# Patient Record
Sex: Female | Born: 1940
Health system: Southern US, Community
[De-identification: ages and names within clinical notes are randomized; demographics above are authoritative.]

## PROBLEM LIST (undated history)

## (undated) DIAGNOSIS — K219 Gastro-esophageal reflux disease without esophagitis: Secondary | ICD-10-CM

## (undated) DIAGNOSIS — D849 Immunodeficiency, unspecified: Secondary | ICD-10-CM

## (undated) DIAGNOSIS — M5136 Other intervertebral disc degeneration, lumbar region: Secondary | ICD-10-CM

## (undated) DIAGNOSIS — M1990 Unspecified osteoarthritis, unspecified site: Secondary | ICD-10-CM

## (undated) DIAGNOSIS — K754 Autoimmune hepatitis: Secondary | ICD-10-CM

## (undated) DIAGNOSIS — M109 Gout, unspecified: Secondary | ICD-10-CM

## (undated) DIAGNOSIS — K648 Other hemorrhoids: Secondary | ICD-10-CM

## (undated) DIAGNOSIS — D649 Anemia, unspecified: Secondary | ICD-10-CM

## (undated) DIAGNOSIS — I878 Other specified disorders of veins: Secondary | ICD-10-CM

## (undated) DIAGNOSIS — I10 Essential (primary) hypertension: Secondary | ICD-10-CM

## (undated) DIAGNOSIS — M51369 Other intervertebral disc degeneration, lumbar region without mention of lumbar back pain or lower extremity pain: Secondary | ICD-10-CM

## (undated) DIAGNOSIS — D72819 Decreased white blood cell count, unspecified: Secondary | ICD-10-CM

## (undated) HISTORY — DX: Other intervertebral disc degeneration, lumbar region without mention of lumbar back pain or lower extremity pain: M51.369

## (undated) HISTORY — PX: APPENDECTOMY: SHX54

## (undated) HISTORY — DX: Other intervertebral disc degeneration, lumbar region: M51.36

## (undated) HISTORY — PX: NECK SURGERY: SHX720

## (undated) HISTORY — DX: Other hemorrhoids: K64.8

## (undated) HISTORY — PX: ABDOMINAL HYSTERECTOMY: SHX81

## (undated) HISTORY — PX: LEFT OOPHORECTOMY: SHX1961

## (undated) HISTORY — DX: Gout, unspecified: M10.9

## (undated) HISTORY — PX: ROTATOR CUFF REPAIR: SHX139

## (undated) HISTORY — PX: BREAST SURGERY: SHX581

## (undated) HISTORY — DX: Autoimmune hepatitis: K75.4

---

## 1978-12-09 HISTORY — PX: BREAST BIOPSY: SHX20

## 1999-12-10 HISTORY — PX: NECK SURGERY: SHX720

## 2000-07-18 ENCOUNTER — Encounter: Payer: Self-pay | Admitting: Neurosurgery

## 2000-07-18 ENCOUNTER — Ambulatory Visit (HOSPITAL_COMMUNITY): Admission: RE | Admit: 2000-07-18 | Discharge: 2000-07-18 | Payer: Self-pay | Admitting: Neurosurgery

## 2000-07-22 ENCOUNTER — Encounter: Payer: Self-pay | Admitting: Neurosurgery

## 2000-07-24 ENCOUNTER — Encounter: Payer: Self-pay | Admitting: Neurosurgery

## 2000-07-24 ENCOUNTER — Inpatient Hospital Stay (HOSPITAL_COMMUNITY): Admission: RE | Admit: 2000-07-24 | Discharge: 2000-07-25 | Payer: Self-pay | Admitting: Neurosurgery

## 2000-08-07 ENCOUNTER — Encounter: Admission: RE | Admit: 2000-08-07 | Discharge: 2000-08-07 | Payer: Self-pay | Admitting: Neurosurgery

## 2000-08-07 ENCOUNTER — Encounter: Payer: Self-pay | Admitting: Neurosurgery

## 2000-11-05 ENCOUNTER — Encounter: Payer: Self-pay | Admitting: Neurosurgery

## 2000-11-05 ENCOUNTER — Encounter: Admission: RE | Admit: 2000-11-05 | Discharge: 2000-11-05 | Payer: Self-pay | Admitting: Neurosurgery

## 2004-11-12 ENCOUNTER — Ambulatory Visit: Payer: Self-pay | Admitting: Internal Medicine

## 2005-06-02 ENCOUNTER — Inpatient Hospital Stay: Payer: Self-pay | Admitting: Internal Medicine

## 2005-06-02 ENCOUNTER — Other Ambulatory Visit: Payer: Self-pay

## 2005-11-21 ENCOUNTER — Ambulatory Visit: Payer: Self-pay | Admitting: Internal Medicine

## 2006-12-04 ENCOUNTER — Ambulatory Visit: Payer: Self-pay | Admitting: Internal Medicine

## 2007-02-20 ENCOUNTER — Encounter: Payer: Self-pay | Admitting: Unknown Physician Specialty

## 2007-03-10 ENCOUNTER — Encounter: Payer: Self-pay | Admitting: Unknown Physician Specialty

## 2007-04-09 ENCOUNTER — Encounter: Payer: Self-pay | Admitting: Unknown Physician Specialty

## 2007-05-10 ENCOUNTER — Encounter: Payer: Self-pay | Admitting: Unknown Physician Specialty

## 2007-05-29 ENCOUNTER — Ambulatory Visit: Payer: Self-pay | Admitting: Cardiology

## 2007-12-17 ENCOUNTER — Ambulatory Visit: Payer: Self-pay | Admitting: Internal Medicine

## 2008-04-05 ENCOUNTER — Ambulatory Visit: Payer: Self-pay | Admitting: Unknown Physician Specialty

## 2008-05-10 ENCOUNTER — Ambulatory Visit: Payer: Self-pay | Admitting: Unknown Physician Specialty

## 2008-09-16 ENCOUNTER — Ambulatory Visit: Payer: Self-pay | Admitting: Internal Medicine

## 2008-12-22 ENCOUNTER — Ambulatory Visit: Payer: Self-pay | Admitting: Internal Medicine

## 2009-11-06 ENCOUNTER — Ambulatory Visit: Payer: Self-pay | Admitting: Gastroenterology

## 2009-12-25 ENCOUNTER — Ambulatory Visit: Payer: Self-pay | Admitting: Internal Medicine

## 2010-10-20 ENCOUNTER — Emergency Department: Payer: Self-pay | Admitting: Unknown Physician Specialty

## 2010-12-27 ENCOUNTER — Ambulatory Visit: Payer: Self-pay | Admitting: Internal Medicine

## 2012-01-07 ENCOUNTER — Ambulatory Visit: Payer: Self-pay | Admitting: Internal Medicine

## 2012-02-03 ENCOUNTER — Ambulatory Visit: Payer: Self-pay | Admitting: Gastroenterology

## 2012-06-19 ENCOUNTER — Emergency Department: Payer: Self-pay | Admitting: Emergency Medicine

## 2012-06-19 LAB — COMPREHENSIVE METABOLIC PANEL
Albumin: 3.4 g/dL (ref 3.4–5.0)
Alkaline Phosphatase: 58 U/L (ref 50–136)
Anion Gap: 8 (ref 7–16)
BUN: 10 mg/dL (ref 7–18)
Bilirubin,Total: 0.7 mg/dL (ref 0.2–1.0)
Calcium, Total: 9.2 mg/dL (ref 8.5–10.1)
Chloride: 102 mmol/L (ref 98–107)
Co2: 30 mmol/L (ref 21–32)
Creatinine: 0.99 mg/dL (ref 0.60–1.30)
EGFR (African American): >60
EGFR (Non-African Amer.): 58 — ABNORMAL LOW
Glucose: 106 mg/dL — ABNORMAL HIGH (ref 65–99)
Osmolality: 279 (ref 275–301)
Potassium: 3.1 mmol/L — ABNORMAL LOW (ref 3.5–5.1)
SGOT(AST): 22 U/L (ref 15–37)
SGPT (ALT): 20 U/L
Sodium: 140 mmol/L (ref 136–145)
Total Protein: 7.7 g/dL (ref 6.4–8.2)

## 2012-06-19 LAB — CBC
HCT: 37.1 % (ref 35.0–47.0)
HGB: 12.4 g/dL (ref 12.0–16.0)
MCH: 30.8 pg (ref 26.0–34.0)
MCHC: 33.5 g/dL (ref 32.0–36.0)
MCV: 92 fL (ref 80–100)
Platelet: 194 10*3/uL (ref 150–440)
RBC: 4.03 10*6/uL (ref 3.80–5.20)
RDW: 14.2 % (ref 11.5–14.5)
WBC: 5.3 10*3/uL (ref 3.6–11.0)

## 2012-06-19 LAB — TROPONIN I: Troponin-I: 0.03 ng/mL

## 2012-06-19 LAB — CK TOTAL AND CKMB (NOT AT ARMC)
CK, Total: 64 U/L (ref 21–215)
CK-MB: <0.5 ng/mL — ABNORMAL LOW (ref 0.5–3.6)

## 2012-12-09 HISTORY — PX: ROTATOR CUFF REPAIR: SHX139

## 2013-01-07 ENCOUNTER — Ambulatory Visit: Payer: Self-pay | Admitting: Internal Medicine

## 2013-01-11 ENCOUNTER — Ambulatory Visit: Payer: Self-pay | Admitting: Gastroenterology

## 2013-04-22 ENCOUNTER — Ambulatory Visit: Payer: Self-pay | Admitting: Unknown Physician Specialty

## 2013-05-13 ENCOUNTER — Ambulatory Visit: Payer: Self-pay | Admitting: Anesthesiology

## 2013-05-13 LAB — ELECTROLYTE PANEL
Anion Gap: 9 (ref 7–16)
Chloride: 101 mmol/L (ref 98–107)
Potassium: 3.6 mmol/L (ref 3.5–5.1)
Sodium: 139 mmol/L (ref 136–145)

## 2013-05-21 ENCOUNTER — Ambulatory Visit: Payer: Self-pay | Admitting: Unknown Physician Specialty

## 2013-08-30 ENCOUNTER — Ambulatory Visit: Payer: Self-pay | Admitting: Gastroenterology

## 2013-09-20 DIAGNOSIS — R3129 Other microscopic hematuria: Secondary | ICD-10-CM | POA: Insufficient documentation

## 2013-09-20 DIAGNOSIS — N281 Cyst of kidney, acquired: Secondary | ICD-10-CM | POA: Insufficient documentation

## 2013-12-11 ENCOUNTER — Emergency Department: Payer: Self-pay | Admitting: Emergency Medicine

## 2013-12-11 LAB — URINALYSIS, COMPLETE
BILIRUBIN, UR: NEGATIVE
Bacteria: NONE SEEN
GLUCOSE, UR: NEGATIVE mg/dL (ref 0–75)
Ketone: NEGATIVE
Leukocyte Esterase: NEGATIVE
Nitrite: NEGATIVE
PH: 7 (ref 4.5–8.0)
Protein: NEGATIVE
RBC,UR: 2 /HPF (ref 0–5)
SPECIFIC GRAVITY: 1.004 (ref 1.003–1.030)
WBC UR: <1 /HPF (ref 0–5)

## 2013-12-11 LAB — CBC WITH DIFFERENTIAL/PLATELET
BASOS ABS: 0 10*3/uL (ref 0.0–0.1)
Basophil %: 0.6 %
EOS ABS: 0 10*3/uL (ref 0.0–0.7)
Eosinophil %: 0.1 %
HCT: 35.7 % (ref 35.0–47.0)
HGB: 12.2 g/dL (ref 12.0–16.0)
LYMPHS PCT: 12.4 %
Lymphocyte #: 0.7 10*3/uL — ABNORMAL LOW (ref 1.0–3.6)
MCH: 30.6 pg (ref 26.0–34.0)
MCHC: 34.3 g/dL (ref 32.0–36.0)
MCV: 89 fL (ref 80–100)
Monocyte #: 0.4 x10 3/mm (ref 0.2–0.9)
Monocyte %: 6.3 %
NEUTROS ABS: 4.6 10*3/uL (ref 1.4–6.5)
NEUTROS PCT: 80.6 %
PLATELETS: 250 10*3/uL (ref 150–440)
RBC: 4 10*6/uL (ref 3.80–5.20)
RDW: 13.8 % (ref 11.5–14.5)
WBC: 5.8 10*3/uL (ref 3.6–11.0)

## 2013-12-11 LAB — COMPREHENSIVE METABOLIC PANEL
ALK PHOS: 57 U/L
Albumin: 3.5 g/dL (ref 3.4–5.0)
Anion Gap: 6 — ABNORMAL LOW (ref 7–16)
BUN: 10 mg/dL (ref 7–18)
Bilirubin,Total: 0.6 mg/dL (ref 0.2–1.0)
Calcium, Total: 9.9 mg/dL (ref 8.5–10.1)
Chloride: 104 mmol/L (ref 98–107)
Co2: 28 mmol/L (ref 21–32)
Creatinine: 0.91 mg/dL (ref 0.60–1.30)
EGFR (African American): >60
GLUCOSE: 164 mg/dL — AB (ref 65–99)
Osmolality: 278 (ref 275–301)
POTASSIUM: 3.7 mmol/L (ref 3.5–5.1)
SGOT(AST): 16 U/L (ref 15–37)
SGPT (ALT): 20 U/L (ref 12–78)
SODIUM: 138 mmol/L (ref 136–145)
Total Protein: 7.7 g/dL (ref 6.4–8.2)

## 2013-12-11 LAB — PROTIME-INR
INR: 1
Prothrombin Time: 13.4 secs (ref 11.5–14.7)

## 2013-12-11 LAB — HEMATOCRIT: HCT: 35.4 % (ref 35.0–47.0)

## 2013-12-11 LAB — TROPONIN I: Troponin-I: <0.02 ng/mL

## 2013-12-11 LAB — LIPASE, BLOOD: LIPASE: 80 U/L (ref 73–393)

## 2013-12-11 LAB — HEMOGLOBIN: HGB: 12.2 g/dL (ref 12.0–16.0)

## 2014-01-21 ENCOUNTER — Ambulatory Visit: Payer: Self-pay | Admitting: Internal Medicine

## 2014-10-21 DIAGNOSIS — M542 Cervicalgia: Secondary | ICD-10-CM | POA: Insufficient documentation

## 2015-01-30 DIAGNOSIS — E538 Deficiency of other specified B group vitamins: Secondary | ICD-10-CM | POA: Insufficient documentation

## 2015-02-09 ENCOUNTER — Ambulatory Visit: Payer: Self-pay | Admitting: Internal Medicine

## 2015-04-28 ENCOUNTER — Inpatient Hospital Stay
Admission: EM | Admit: 2015-04-28 | Discharge: 2015-05-01 | DRG: 640 | Disposition: A | Discharge status: Home / Self Care | Payer: Medicare Other | Attending: Internal Medicine | Admitting: Internal Medicine

## 2015-04-28 ENCOUNTER — Emergency Department: Payer: Medicare Other

## 2015-04-28 ENCOUNTER — Encounter: Payer: Self-pay | Admitting: Medical Oncology

## 2015-04-28 DIAGNOSIS — Z90710 Acquired absence of both cervix and uterus: Secondary | ICD-10-CM

## 2015-04-28 DIAGNOSIS — I10 Essential (primary) hypertension: Secondary | ICD-10-CM | POA: Diagnosis present

## 2015-04-28 DIAGNOSIS — K754 Autoimmune hepatitis: Secondary | ICD-10-CM | POA: Diagnosis present

## 2015-04-28 DIAGNOSIS — R531 Weakness: Secondary | ICD-10-CM

## 2015-04-28 DIAGNOSIS — R05 Cough: Secondary | ICD-10-CM

## 2015-04-28 DIAGNOSIS — Z880 Allergy status to penicillin: Secondary | ICD-10-CM

## 2015-04-28 DIAGNOSIS — Z833 Family history of diabetes mellitus: Secondary | ICD-10-CM | POA: Diagnosis not present

## 2015-04-28 DIAGNOSIS — R042 Hemoptysis: Secondary | ICD-10-CM | POA: Diagnosis present

## 2015-04-28 DIAGNOSIS — R509 Fever, unspecified: Secondary | ICD-10-CM

## 2015-04-28 DIAGNOSIS — J189 Pneumonia, unspecified organism: Secondary | ICD-10-CM | POA: Diagnosis present

## 2015-04-28 DIAGNOSIS — E871 Hypo-osmolality and hyponatremia: Principal | ICD-10-CM | POA: Diagnosis present

## 2015-04-28 DIAGNOSIS — Z9049 Acquired absence of other specified parts of digestive tract: Secondary | ICD-10-CM | POA: Diagnosis present

## 2015-04-28 DIAGNOSIS — R059 Cough, unspecified: Secondary | ICD-10-CM

## 2015-04-28 HISTORY — DX: Immunodeficiency, unspecified: D84.9

## 2015-04-28 HISTORY — DX: Essential (primary) hypertension: I10

## 2015-04-28 HISTORY — DX: Decreased white blood cell count, unspecified: D72.819

## 2015-04-28 HISTORY — DX: Other specified disorders of veins: I87.8

## 2015-04-28 HISTORY — DX: Anemia, unspecified: D64.9

## 2015-04-28 LAB — URINALYSIS COMPLETE WITH MICROSCOPIC (ARMC ONLY)
Bacteria, UA: NONE SEEN
Bilirubin Urine: NEGATIVE
Glucose, UA: NEGATIVE mg/dL
KETONES UR: NEGATIVE mg/dL
Leukocytes, UA: NEGATIVE
Nitrite: NEGATIVE
PH: 6 (ref 5.0–8.0)
Protein, ur: NEGATIVE mg/dL
Specific Gravity, Urine: 1.008 (ref 1.005–1.030)

## 2015-04-28 LAB — BASIC METABOLIC PANEL
Anion gap: 8 (ref 5–15)
BUN: 8 mg/dL (ref 6–20)
CALCIUM: 8.6 mg/dL — AB (ref 8.9–10.3)
CHLORIDE: 92 mmol/L — AB (ref 101–111)
CO2: 23 mmol/L (ref 22–32)
CREATININE: 1.04 mg/dL — AB (ref 0.44–1.00)
GFR calc Af Amer: >60 mL/min (ref >60)
GFR calc non Af Amer: 53 mL/min — ABNORMAL LOW (ref >60)
GLUCOSE: 122 mg/dL — AB (ref 65–99)
Potassium: 4.1 mmol/L (ref 3.5–5.1)
Sodium: 123 mmol/L — ABNORMAL LOW (ref 135–145)

## 2015-04-28 LAB — TSH: TSH: 3.467 u[IU]/mL (ref 0.350–4.500)

## 2015-04-28 LAB — CBC
HEMATOCRIT: 30.5 % — AB (ref 35.0–47.0)
Hemoglobin: 10.5 g/dL — ABNORMAL LOW (ref 12.0–16.0)
MCH: 29.7 pg (ref 26.0–34.0)
MCHC: 34.3 g/dL (ref 32.0–36.0)
MCV: 86.7 fL (ref 80.0–100.0)
Platelets: 247 10*3/uL (ref 150–440)
RBC: 3.52 MIL/uL — ABNORMAL LOW (ref 3.80–5.20)
RDW: 14.7 % — ABNORMAL HIGH (ref 11.5–14.5)
WBC: 3.6 10*3/uL (ref 3.6–11.0)

## 2015-04-28 LAB — HEPATIC FUNCTION PANEL
ALT: 15 U/L (ref 14–54)
AST: 31 U/L (ref 15–41)
Albumin: 3.2 g/dL — ABNORMAL LOW (ref 3.5–5.0)
Alkaline Phosphatase: 56 U/L (ref 38–126)
Bilirubin, Direct: 0.1 mg/dL (ref 0.1–0.5)
Indirect Bilirubin: 0.3 mg/dL (ref 0.3–0.9)
TOTAL PROTEIN: 7.4 g/dL (ref 6.5–8.1)
Total Bilirubin: 0.4 mg/dL (ref 0.3–1.2)

## 2015-04-28 MED ORDER — ISOSORBIDE MONONITRATE ER 60 MG PO TB24
120.0000 mg | ORAL_TABLET | Freq: Every day | ORAL | Status: DC
  Administered 2015-04-29 – 2015-05-01 (×3): 120 mg via ORAL
  Filled 2015-04-28 (×3): qty 2

## 2015-04-28 MED ORDER — DILTIAZEM HCL ER 240 MG PO CP24
240.0000 mg | ORAL_CAPSULE | Freq: Every day | ORAL | Status: DC
  Administered 2015-04-29 – 2015-05-01 (×3): 240 mg via ORAL
  Filled 2015-04-28 (×5): qty 1

## 2015-04-28 MED ORDER — ACETAMINOPHEN 325 MG PO TABS
650.0000 mg | ORAL_TABLET | Freq: Four times a day (QID) | ORAL | Status: DC | PRN
  Administered 2015-04-29 – 2015-04-30 (×4): 650 mg via ORAL
  Filled 2015-04-28 (×4): qty 2

## 2015-04-28 MED ORDER — LORATADINE 10 MG PO TABS: 10.0000 mg | ORAL_TABLET | Freq: Every day | ORAL | Status: DC

## 2015-04-28 MED ORDER — CARVEDILOL 25 MG PO TABS
25.0000 mg | ORAL_TABLET | Freq: Two times a day (BID) | ORAL | Status: DC
  Administered 2015-04-29 – 2015-05-01 (×5): 25 mg via ORAL
  Filled 2015-04-28 (×5): qty 1

## 2015-04-28 MED ORDER — DOCUSATE SODIUM 100 MG PO CAPS
100.0000 mg | ORAL_CAPSULE | Freq: Two times a day (BID) | ORAL | Status: DC
  Administered 2015-04-29 – 2015-05-01 (×6): 100 mg via ORAL
  Filled 2015-04-28 (×6): qty 1

## 2015-04-28 MED ORDER — SODIUM CHLORIDE 0.9 % IV SOLN
INTRAVENOUS | Status: DC
  Administered 2015-04-29 – 2015-04-30 (×3): via INTRAVENOUS

## 2015-04-28 MED ORDER — LEVOFLOXACIN 750 MG PO TABS
ORAL_TABLET | ORAL | Status: AC
  Administered 2015-04-28: 750 mg via ORAL
  Filled 2015-04-28: qty 1

## 2015-04-28 MED ORDER — MORPHINE SULFATE 2 MG/ML IJ SOLN: 2.0000 mg | INTRAMUSCULAR | Status: DC | PRN

## 2015-04-28 MED ORDER — HEPARIN SODIUM (PORCINE) 5000 UNIT/ML IJ SOLN
5000.0000 [IU] | Freq: Three times a day (TID) | INTRAMUSCULAR | Status: DC
  Administered 2015-04-29 – 2015-05-01 (×9): 5000 [IU] via SUBCUTANEOUS
  Filled 2015-04-28 (×10): qty 1

## 2015-04-28 MED ORDER — CALCIUM CARBONATE-VITAMIN D 500-200 MG-UNIT PO TABS
1.0000 | ORAL_TABLET | Freq: Two times a day (BID) | ORAL | Status: DC
  Administered 2015-04-29 – 2015-05-01 (×6): 1 via ORAL
  Filled 2015-04-28 (×6): qty 1

## 2015-04-28 MED ORDER — ONDANSETRON HCL 4 MG/2ML IJ SOLN
4.0000 mg | Freq: Four times a day (QID) | INTRAMUSCULAR | Status: DC | PRN
  Administered 2015-04-29 (×2): 4 mg via INTRAVENOUS
  Filled 2015-04-28 (×2): qty 2

## 2015-04-28 MED ORDER — ONDANSETRON HCL 4 MG PO TABS
4.0000 mg | ORAL_TABLET | Freq: Four times a day (QID) | ORAL | Status: DC | PRN
  Administered 2015-04-30: 4 mg via ORAL
  Filled 2015-04-28: qty 1

## 2015-04-28 MED ORDER — ACETAMINOPHEN 650 MG RE SUPP: 650.0000 mg | Freq: Four times a day (QID) | RECTAL | Status: DC | PRN

## 2015-04-28 MED ORDER — MINOXIDIL 2.5 MG PO TABS
2.5000 mg | ORAL_TABLET | Freq: Two times a day (BID) | ORAL | Status: DC
  Administered 2015-04-29 – 2015-05-01 (×6): 2.5 mg via ORAL
  Filled 2015-04-28 (×7): qty 1

## 2015-04-28 MED ORDER — ASPIRIN EC 81 MG PO TBEC
81.0000 mg | DELAYED_RELEASE_TABLET | Freq: Every day | ORAL | Status: DC
  Administered 2015-04-29 – 2015-05-01 (×3): 81 mg via ORAL
  Filled 2015-04-28 (×3): qty 1

## 2015-04-28 MED ORDER — AZATHIOPRINE 50 MG PO TABS
50.0000 mg | ORAL_TABLET | Freq: Every day | ORAL | Status: DC
  Administered 2015-04-29 – 2015-04-30 (×2): 50 mg via ORAL
  Filled 2015-04-28 (×3): qty 1

## 2015-04-28 MED ORDER — LEVOFLOXACIN 750 MG PO TABS
750.0000 mg | ORAL_TABLET | Freq: Once | ORAL | Status: AC
  Administered 2015-04-28: 750 mg via ORAL

## 2015-04-28 MED ORDER — SODIUM CHLORIDE 0.9 % IV BOLUS (SEPSIS)
1000.0000 mL | Freq: Once | INTRAVENOUS | Status: AC
  Administered 2015-04-28: 1000 mL via INTRAVENOUS

## 2015-04-28 MED ORDER — PANTOPRAZOLE SODIUM 40 MG PO TBEC
40.0000 mg | DELAYED_RELEASE_TABLET | Freq: Every day | ORAL | Status: DC
Start: 2015-04-29 — End: 2015-05-01
  Administered 2015-04-29 – 2015-05-01 (×3): 40 mg via ORAL
  Filled 2015-04-28 (×3): qty 1

## 2015-04-28 NOTE — ED Provider Notes (Signed)
[facility] Emergency Department Provider Note  Time seen: 9:08 PM  I have reviewed the triage vital signs and the nursing notes.   HISTORY  Chief Complaint Fatigue and Weakness    HPI Brittney Clark is a 74 y.o. female with a past medical history of hypertension, anemia presents the emergency department with 5 days of weakness/fatigue symptoms. Patient was seen by her primary care doctor yesterday who ordered blood work and placed the patient on doxycycline for possible tick bite exposure although patient does not recall actually being bit. Patient states for the last 3 days she has been having fevers to 102 at home, and generalized weakness symptoms. Denies headache, confusion, abdominal pain, chest pain, cough/congestion, or sore throat. Patient does state some nausea with one episode of vomiting yesterday. 1-2 episodes of diarrhea over the last few days but not constant. Patient states her primary care doctor did blood work yesterday and called her today due to a low sodium told her to go to the urgent care to have her blood work redrawn which she did and they sent her to the ER.    Past Medical History  Diagnosis Date  . Hypertension   . Immune deficiency disorder   . Leukopenia   . Anemia   . Venous stasis     There are no active problems to display for this patient.   Past Surgical History  Procedure Laterality Date  . Neck surgery    . Abdominal hysterectomy    . Breast surgery    . Appendectomy    . Rotator cuff repair      No current outpatient prescriptions on file.  Allergies Penicillins  No family history on file.  Social History History  Substance Use Topics  . Smoking status: Never Smoker   . Smokeless tobacco: Not on file  . Alcohol Use: No    Review of Systems Constitutional: Positive for fever to 102. ENT: Negative for congestion Cardiovascular: Negative for chest pain. Respiratory: Negative for shortness of  breath. Gastrointestinal: Negative for abdominal pain. Positive for one episode of vomiting, one to 2 episodes of diarrhea. Genitourinary: Negative for dysuria. Musculoskeletal: Negative for back pain. Skin: Negative for rash. Neurological: Negative for headaches, focal weakness or numbness, but positive for generalized weakness.  10-point ROS otherwise negative.  ____________________________________________   PHYSICAL EXAM:  VITAL SIGNS: ED Triage Vitals  Enc Vitals Group     BP 04/28/15 1836 122/63 mmHg     Pulse Rate 04/28/15 1836 78     Resp 04/28/15 1836 18     Temp 04/28/15 1836 98.1 F (36.7 C)     Temp Source 04/28/15 1836 Oral     SpO2 04/28/15 1836 96 %     Weight 04/28/15 1836 161 lb (73.029 kg)     Height 04/28/15 1836 5\' 5"  (1.651 m)     Head Cir --      Peak Flow --      Pain Score 04/28/15 1837 0     Pain Loc --      Pain Edu? --      Excl. in [place]? --     Constitutional: Alert and oriented. Well appearing and in no distress. Eyes: Normal exam ENT   Head: Normocephalic and atraumatic.   Nose: No congestion/rhinnorhea.   Mouth/Throat: Mucous membranes are moist. Cardiovascular: Normal rate, regular rhythm. No murmurs, rubs, or gallops. Respiratory: Normal respiratory effort without tachypnea nor retractions. Breath sounds are clear and equal  bilaterally. No wheezes/rales/rhonchi. Gastrointestinal: Soft and nontender. No distention.   Musculoskeletal: Nontender with normal range of motion in all extremities. Neurologic:  Normal speech and language. No gross focal neurologic deficits are appreciated. Speech is normal. Skin:  Skin is warm, dry and intact.  Psychiatric: Mood and affect are normal. Speech and behavior are normal. Patient exhibits appropriate insight and judgment.  ____________________________________________    EKG  EKG reviewed and interpreted by myself shows normal sinus rhythm at 74 bpm, narrow QRS, normal axis, normal  intervals. Nonspecific ST changes.  ____________________________________________    RADIOLOGY  Chest x-ray shows right basilar opacity which may reflect atelectasis versus pneumonia.  ____________________________________________   INITIAL IMPRESSION / ASSESSMENT AND PLAN / ED COURSE  Pertinent labs & imaging results that were available during my care of the patient were reviewed by me and considered in my medical decision making (see chart for details).  74 year old female with 5 days of generalized weakness, fever, and low sodium level. Patient's sodium today is 123, labs otherwise are largely within normal limits. Overall patient appears very well, with minimal complaints. Patient is currently taking doxycycline prescribed by her primary care doctor for possible tick exposure. I have added on several labs including TSH, liver function testing, and a chest x-ray to further evaluate. I placed the patient on normal saline IV hydration. Patient will require admission to the hospital for further workup/evaluation.  Mild airspace opacity on chest x-ray we'll cover with antibiotics and discussed this with the hospitalist for further workup and possible CT if indicated.  ____________________________________________   FINAL CLINICAL IMPRESSION(S) / ED DIAGNOSES  Hyponatremia Fever Generalized weakness Pneumonia   Harvest Dark, MD 04/28/15 2131

## 2015-04-28 NOTE — H&P (Signed)
[place] [facility] [place] NAME: Brittney Clark    MR#:  [id number]  DATE OF BIRTH:  03-16-1941   DATE OF ADMISSION:  04/28/2015  PRIMARY CARE PHYSICIAN: Tama High III, MD   REQUESTING/REFERRING PHYSICIAN: Paduchowski  CHIEF COMPLAINT:   Chief Complaint  Patient presents with  . Fatigue  . Weakness    HISTORY OF PRESENT ILLNESS:  Brittney Clark  is a 74 y.o. female with a known history of essential hypertension presenting with abnormal labs. She describes having weakness and generalized fatigue for approximately 3 days with associated nausea, intermittent fever, cough-nonproductive. Saw her PCP started on doxycycline and had routine blood work obtained. She was noted to be hyponatremic with sodium levels of 121 thus advised to present to Hospital further workup and evaluation.  PAST MEDICAL HISTORY:   Past Medical History  Diagnosis Date  . Hypertension   . Immune deficiency disorder   . Leukopenia   . Anemia   . Venous stasis     PAST SURGICAL HISTORY:   Past Surgical History  Procedure Laterality Date  . Neck surgery    . Abdominal hysterectomy    . Breast surgery    . Appendectomy    . Rotator cuff repair      SOCIAL HISTORY:   History  Substance Use Topics  . Smoking status: Never Smoker   . Smokeless tobacco: Not on file  . Alcohol Use: No    FAMILY HISTORY:   Family History  Problem Relation Age of Onset  . Diabetes Neg Hx     DRUG ALLERGIES:   Allergies  Allergen Reactions  . Penicillins Rash    REVIEW OF SYSTEMS:  REVIEW OF SYSTEMS:  CONSTITUTIONAL: Denies fevers, chills,positive for fatigue, weakness.  EYES: Denies blurred vision, double vision, or eye pain.  EARS, NOSE, THROAT: Denies tinnitus, ear pain, hearing loss.  RESPIRATORY: positive for cough, deniesshortness of breath, wheezing  CARDIOVASCULAR: Denies chest pain, palpitations, edema.  GASTROINTESTINAL: positivenausea,  deniesvomiting, diarrhea, abdominal pain.  GENITOURINARY: Denies dysuria, hematuria.  ENDOCRINE: Denies nocturia or thyroid problems. HEMATOLOGIC AND LYMPHATIC: Denies easy bruising or bleeding.  SKIN: Denies rash or lesions.  MUSCULOSKELETAL: Denies pain in neck, back, shoulder, knees, hips, or further arthritic symptoms.  NEUROLOGIC: Denies paralysis, paresthesias.  PSYCHIATRIC: Denies anxiety or depressive symptoms. Otherwise full review of systems performed by me is negative.   MEDICATIONS AT HOME:   Prior to Admission medications   Not on File      VITAL SIGNS:  Blood pressure 157/86, pulse 87, temperature 98.1 F (36.7 C), temperature source Oral, resp. rate 18, height 5\' 5"  (1.651 m), weight 161 lb (73.029 kg), SpO2 97 %.  PHYSICAL EXAMINATION:  VITAL SIGNS: Filed Vitals:   04/28/15 2040  BP: 157/86  Pulse: 87  Temp:   Resp: 29   GENERAL:73 y.o.female currently in no acute distress.  HEAD: Normocephalic, atraumatic.  EYES: Pupils equal, round, reactive to light. Extraocular muscles intact. No scleral icterus.  MOUTH: Moist mucosal membrane. Dentition intact. No abscess noted.  EAR, NOSE, THROAT: Clear without exudates. No external lesions.  NECK: Supple. No thyromegaly. No nodules. No JVD.  PULMONARY: Clear to ascultation, without wheeze rails or rhonci. No use of accessory muscles, Good respiratory effort. good air entry bilaterally CHEST: Nontender to palpation.  CARDIOVASCULAR: S1 and S2. Regular rate and rhythm. No murmurs, rubs, or gallops. No edema. Pedal pulses 2+ bilaterally.  GASTROINTESTINAL: Soft, nontender, nondistended. No masses.  Positive bowel sounds. No hepatosplenomegaly.  MUSCULOSKELETAL: No swelling, clubbing, or edema. Range of motion full in all extremities.  NEUROLOGIC: Cranial nerves II through XII are intact. No gross focal neurological deficits. Sensation intact. Reflexes intact.  SKIN: No ulceration, lesions, rashes, or cyanosis. Skin warm  and dry. Turgor intact.  PSYCHIATRIC: Mood, affect within normal limits. The patient is awake, alert and oriented x 3. Insight, judgment intact.    LABORATORY PANEL:   CBC  Recent Labs Lab 04/28/15 1852  WBC 3.6  HGB 10.5*  HCT 30.5*  PLT 247   ------------------------------------------------------------------------------------------------------------------  Chemistries   Recent Labs Lab 04/28/15 1852  NA 123*  K 4.1  CL 92*  CO2 23  GLUCOSE 122*  BUN 8  CREATININE 1.04*  CALCIUM 8.6*   ------------------------------------------------------------------------------------------------------------------  Cardiac Enzymes No results for input(s): TROPONINI in the last 168 hours. ------------------------------------------------------------------------------------------------------------------  RADIOLOGY:  Dg Chest Portable 1 View  04/28/2015   CLINICAL DATA:  Acute onset of cough.  Weakness.  Initial encounter.  EXAM: PORTABLE CHEST - 1 VIEW  COMPARISON:  Chest radiograph from 06/19/2012  FINDINGS: The lungs are well-aerated. Pulmonary vascularity is at the upper limits of normal. Mild right basilar airspace opacity may reflect atelectasis or possibly mild pneumonia. There is no evidence of pleural effusion or pneumothorax.  The cardiomediastinal silhouette is mildly enlarged. No acute osseous abnormalities are seen. Cervical spinal fusion hardware is noted.  IMPRESSION: Mild cardiomegaly; mild right basilar airspace opacity may reflect atelectasis or possibly mild pneumonia.   Electronically Signed   By: Garald Balding M.D.   On: 04/28/2015 21:26    EKG:   Orders placed or performed during the hospital encounter of 04/28/15  . ED EKG  . ED EKG    IMPRESSION AND PLAN:   74 year old African female history of essential hypertension presenting with generalized weakness and abnormal labs.  1. Hyponatremia: Check urine sodium/osmolality, sodium deficit of 365 mEq she has  received 1 L normal saline in emergency department she will require 57 mL/h for the next 24 hours to correct her sodium to 133. She is presenting hydrochlorothiazide continue to hold this, given findings suggestive of pneumonia also question SIADH possibility  2. Community acquiredpneumonia: Started on Levaquin per ER staff, continue 3.Essential hypertension: Continue to hold diuretics 4.Venous thromboembolism prophylactic: Heparin subcutaneous     All the records are reviewed and case discussed with ED provider. Management plans discussed with the patient, family and they are in agreement.  CODE STATUS: full  TOTAL TIME TAKING CARE OF THIS PATIENT: 35 minutes.    Hower,  Karenann Cai.D on 04/28/2015 at 10:04 PM  Between 7am to 6pm - Pager - [phone]  After 6pm: House Pager: - [phone]  [facility]  Office  [phone]  CC: Primary care physician; Adin Hector, MD

## 2015-04-28 NOTE — ED Notes (Signed)
Pt to triage with reports that pt has been feeling weak and running fever since Tuesday with n/v/d. States that she went to pcp yesterday and was placed on doxycycline. States that she continues to feel weak and tired.

## 2015-04-29 LAB — BASIC METABOLIC PANEL
Anion gap: 7 (ref 5–15)
BUN: 6 mg/dL (ref 6–20)
CALCIUM: 8.5 mg/dL — AB (ref 8.9–10.3)
CHLORIDE: 99 mmol/L — AB (ref 101–111)
CO2: 24 mmol/L (ref 22–32)
Creatinine, Ser: 1.01 mg/dL — ABNORMAL HIGH (ref 0.44–1.00)
GFR calc Af Amer: >60 mL/min (ref >60)
GFR, EST NON AFRICAN AMERICAN: 55 mL/min — AB (ref >60)
GLUCOSE: 100 mg/dL — AB (ref 65–99)
Potassium: 3.8 mmol/L (ref 3.5–5.1)
Sodium: 130 mmol/L — ABNORMAL LOW (ref 135–145)

## 2015-04-29 LAB — CBC
HEMATOCRIT: 31.5 % — AB (ref 35.0–47.0)
HEMOGLOBIN: 10.5 g/dL — AB (ref 12.0–16.0)
MCH: 28.9 pg (ref 26.0–34.0)
MCHC: 33.2 g/dL (ref 32.0–36.0)
MCV: 87 fL (ref 80.0–100.0)
Platelets: 247 10*3/uL (ref 150–440)
RBC: 3.62 MIL/uL — AB (ref 3.80–5.20)
RDW: 15.3 % — ABNORMAL HIGH (ref 11.5–14.5)
WBC: 3.5 10*3/uL — AB (ref 3.6–11.0)

## 2015-04-29 LAB — SODIUM, URINE, RANDOM: SODIUM UR: 108 mmol/L

## 2015-04-29 LAB — OSMOLALITY, URINE: Osmolality, Ur: 427 mOsm/kg (ref 300–900)

## 2015-04-29 MED ORDER — LEVOFLOXACIN 500 MG PO TABS
500.0000 mg | ORAL_TABLET | Freq: Every day | ORAL | Status: DC
  Administered 2015-04-29 – 2015-04-30 (×2): 500 mg via ORAL
  Filled 2015-04-29 (×2): qty 1

## 2015-04-29 NOTE — Progress Notes (Signed)
Pt. Alert and oriented. Vss. No c/o pain. Pills whole with water. Pt. Spiked a temp. During the early morning. Tylenol administered. Up to bathroom with stand-by assist. Rested quietly throughout the night.

## 2015-04-29 NOTE — Progress Notes (Signed)
Pt requested her usual dose of potassium. Dr Ouida Sills notified. He will reassess her in the morning

## 2015-04-29 NOTE — Progress Notes (Signed)
Brittney Clark is a 74 y.o. female patient. 1. Hyponatremia   2. Weakness generalized   3. Fever, unspecified fever cause    Past Medical History  Diagnosis Date  . Hypertension   . Immune deficiency disorder   . Leukopenia   . Anemia   . Venous stasis    Current Facility-Administered Medications  Medication Dose Route Frequency Provider Last Rate Last Dose  . 0.9 %  sodium chloride infusion   Intravenous Continuous Lytle Butte, MD 50 mL/hr at 04/29/15 0020    . acetaminophen (TYLENOL) tablet 650 mg  650 mg Oral Q6H PRN Lytle Butte, MD   650 mg at 04/29/15 0422   Or  . acetaminophen (TYLENOL) suppository 650 mg  650 mg Rectal Q6H PRN Lytle Butte, MD      . aspirin EC tablet 81 mg  81 mg Oral Daily Lytle Butte, MD   81 mg at 04/29/15 4010  . azaTHIOprine (IMURAN) tablet 50 mg  50 mg Oral Daily Lytle Butte, MD   50 mg at 04/29/15 0902  . calcium-vitamin D (OSCAL WITH D) 500-200 MG-UNIT per tablet 1 tablet  1 tablet Oral BID Lytle Butte, MD   1 tablet at 04/29/15 [phone]  . carvedilol (COREG) tablet 25 mg  25 mg Oral BID WC Lytle Butte, MD   25 mg at 04/29/15 3664  . diltiazem (DILACOR XR) 24 hr capsule 240 mg  240 mg Oral Daily Lytle Butte, MD   240 mg at 04/29/15 0902  . docusate sodium (COLACE) capsule 100 mg  100 mg Oral BID Lytle Butte, MD   100 mg at 04/29/15 0912  . heparin injection 5,000 Units  5,000 Units Subcutaneous 3 times per day Lytle Butte, MD   5,000 Units at 04/29/15 0554  . isosorbide mononitrate (IMDUR) 24 hr tablet 120 mg  120 mg Oral Daily Lytle Butte, MD   120 mg at 04/29/15 0902  . loratadine (CLARITIN) tablet 10 mg  10 mg Oral Daily Lytle Butte, MD   10 mg at 04/29/15 1000  . minoxidil (LONITEN) tablet 2.5 mg  2.5 mg Oral BID Lytle Butte, MD   2.5 mg at 04/29/15 0902  . morphine 2 MG/ML injection 2 mg  2 mg Intravenous Q4H PRN Lytle Butte, MD      . ondansetron [facility]) tablet 4 mg  4 mg Oral Q6H PRN Lytle Butte, MD       Or  .  ondansetron [facility]) injection 4 mg  4 mg Intravenous Q6H PRN Lytle Butte, MD      . pantoprazole (PROTONIX) EC tablet 40 mg  40 mg Oral Daily Lytle Butte, MD   40 mg at 04/29/15 4034   Allergies  Allergen Reactions  . Penicillins Rash   Principal Problem:   Hyponatremia Active Problems:   Essential hypertension  Blood pressure 136/64, pulse 89, temperature 98.6 F (37 C), temperature source Oral, resp. rate 18, height 5\' 5"  (1.651 m), weight 72.893 kg (160 lb 11.2 oz), SpO2 97 %.  Subjective  Admitted with pneumonia and weakness and hyponatremia. Mildly improved symptoms with ns, levaquin. Some nausea still noted. No fcs no cp or sob, cough abating Objective  nad Chest; Clear Heart; rrr no mrg Abd; nontender, nl bs Ext; no cce Pleasant, alert and appropriate Assessment & Plan - Hyponatremia; Improving with ivf, follow - Pneumonia; Ok on levaquin, encouraged use of prn  zofran to help with po intake  Signed Kirk Ruths. 04/29/2015

## 2015-04-29 NOTE — Progress Notes (Signed)
Temp 101.9 after receiving tylenol. Dr Ouida Sills notified. Ordered levaquin 500 mg po daily

## 2015-04-30 LAB — BASIC METABOLIC PANEL
ANION GAP: 8 (ref 5–15)
BUN: 5 mg/dL — ABNORMAL LOW (ref 6–20)
CO2: 24 mmol/L (ref 22–32)
Calcium: 8.1 mg/dL — ABNORMAL LOW (ref 8.9–10.3)
Chloride: 98 mmol/L — ABNORMAL LOW (ref 101–111)
Creatinine, Ser: 1.01 mg/dL — ABNORMAL HIGH (ref 0.44–1.00)
GFR, EST NON AFRICAN AMERICAN: 55 mL/min — AB (ref >60)
Glucose, Bld: 98 mg/dL (ref 65–99)
Potassium: 3.4 mmol/L — ABNORMAL LOW (ref 3.5–5.1)
Sodium: 130 mmol/L — ABNORMAL LOW (ref 135–145)

## 2015-04-30 LAB — CBC WITH DIFFERENTIAL/PLATELET
Basophils Absolute: 0 10*3/uL (ref 0–0.1)
Basophils Relative: 0 %
EOS PCT: 0 %
Eosinophils Absolute: 0 10*3/uL (ref 0–0.7)
HEMATOCRIT: 28.5 % — AB (ref 35.0–47.0)
Hemoglobin: 9.7 g/dL — ABNORMAL LOW (ref 12.0–16.0)
LYMPHS ABS: 0.5 10*3/uL — AB (ref 1.0–3.6)
Lymphocytes Relative: 15 %
MCH: 29.5 pg (ref 26.0–34.0)
MCHC: 34.2 g/dL (ref 32.0–36.0)
MCV: 86.3 fL (ref 80.0–100.0)
MONO ABS: 0.7 10*3/uL (ref 0.2–0.9)
MONOS PCT: 23 %
Neutro Abs: 1.9 10*3/uL (ref 1.4–6.5)
Neutrophils Relative %: 62 %
Platelets: 265 10*3/uL (ref 150–440)
RBC: 3.3 MIL/uL — ABNORMAL LOW (ref 3.80–5.20)
RDW: 15.1 % — AB (ref 11.5–14.5)
WBC: 3.1 10*3/uL — ABNORMAL LOW (ref 3.6–11.0)

## 2015-04-30 MED ORDER — PREDNISONE 5 MG PO TABS
5.0000 mg | ORAL_TABLET | Freq: Every day | ORAL | Status: DC
  Administered 2015-04-30: 5 mg via ORAL
  Filled 2015-04-30 (×4): qty 1

## 2015-04-30 NOTE — Progress Notes (Signed)
Pt alert and  Oriented throughout the evening. Pt has been febrile all evening, this morning fever elevated to 101.2. Tylenol has been given. Pt complaining of  A dry nonproductive cough.

## 2015-04-30 NOTE — Progress Notes (Signed)
Fever reduced with tylenol 99.2

## 2015-04-30 NOTE — Progress Notes (Signed)
Brittney Clark is a 74 y.o. female patient. 1. Hyponatremia   2. Weakness generalized   3. Fever, unspecified fever cause    Past Medical History  Diagnosis Date  . Hypertension   . Immune deficiency disorder   . Leukopenia   . Anemia   . Venous stasis    Current Facility-Administered Medications  Medication Dose Route Frequency Provider Last Rate Last Dose  . 0.9 %  sodium chloride infusion   Intravenous Continuous Lytle Butte, MD 50 mL/hr at 04/29/15 2238    . acetaminophen (TYLENOL) tablet 650 mg  650 mg Oral Q6H PRN Lytle Butte, MD   650 mg at 04/30/15 3154   Or  . acetaminophen (TYLENOL) suppository 650 mg  650 mg Rectal Q6H PRN Lytle Butte, MD      . aspirin EC tablet 81 mg  81 mg Oral Daily Lytle Butte, MD   81 mg at 04/30/15 0086  . azaTHIOprine (IMURAN) tablet 50 mg  50 mg Oral Daily Lytle Butte, MD   50 mg at 04/30/15 0940  . calcium-vitamin D (OSCAL WITH D) 500-200 MG-UNIT per tablet 1 tablet  1 tablet Oral BID Lytle Butte, MD   1 tablet at 04/30/15 [phone]  . carvedilol (COREG) tablet 25 mg  25 mg Oral BID WC Lytle Butte, MD   25 mg at 04/30/15 5093  . diltiazem (DILACOR XR) 24 hr capsule 240 mg  240 mg Oral Daily Lytle Butte, MD   240 mg at 04/30/15 0940  . docusate sodium (COLACE) capsule 100 mg  100 mg Oral BID Lytle Butte, MD   100 mg at 04/30/15 2671  . heparin injection 5,000 Units  5,000 Units Subcutaneous 3 times per day Lytle Butte, MD   5,000 Units at 04/30/15 0510  . isosorbide mononitrate (IMDUR) 24 hr tablet 120 mg  120 mg Oral Daily Lytle Butte, MD   120 mg at 04/30/15 2458  . levofloxacin (LEVAQUIN) tablet 500 mg  500 mg Oral Daily Kirk Ruths, MD   500 mg at 04/29/15 2109  . loratadine (CLARITIN) tablet 10 mg  10 mg Oral Daily Lytle Butte, MD   10 mg at 04/29/15 1000  . minoxidil (LONITEN) tablet 2.5 mg  2.5 mg Oral BID Lytle Butte, MD   2.5 mg at 04/30/15 0998  . morphine 2 MG/ML injection 2 mg  2 mg Intravenous Q4H PRN  Lytle Butte, MD      . ondansetron [facility]) tablet 4 mg  4 mg Oral Q6H PRN Lytle Butte, MD   4 mg at 04/30/15 0511   Or  . ondansetron [facility]) injection 4 mg  4 mg Intravenous Q6H PRN Lytle Butte, MD   4 mg at 04/29/15 2110  . pantoprazole (PROTONIX) EC tablet 40 mg  40 mg Oral Daily Lytle Butte, MD   40 mg at 04/30/15 3382  . predniSONE (DELTASONE) tablet 5 mg  5 mg Oral Q breakfast Kirk Ruths, MD       Allergies  Allergen Reactions  . Penicillins Rash   Principal Problem:   Hyponatremia Active Problems:   Essential hypertension  Blood pressure 122/62, pulse 71, temperature 98.8 F (37.1 C), temperature source Oral, resp. rate 18, height 5\' 5"  (1.651 m), weight 72.893 kg (160 lb 11.2 oz), SpO2 95 %.  Subjective  Admitted with pneumonia and weakness and hyponatremia. Mildly improved symptoms with ns, levaquin.  Some nausea still noted. No fcs no cp or sob, cough abating Objective  nad Chest; rll rales Heart; rrr no mrg Abd; nontender, nl bs Ext; no cce Pleasant, alert and appropriate Assessment & Plan - Hyponatremia; Improving with ivf, follow  - Pneumonia;  Levaquin wasn't actually started until yesterday Signed Kirk Ruths. 04/30/2015

## 2015-04-30 NOTE — Progress Notes (Signed)
Pt is febrile 101.3. Tylenol given. No complaint of nausea this shift. Ambulated to bathroom. Daughter at bedside. Coughs up blood tinged sputum at times.

## 2015-05-01 ENCOUNTER — Inpatient Hospital Stay: Payer: Medicare Other

## 2015-05-01 DIAGNOSIS — J189 Pneumonia, unspecified organism: Secondary | ICD-10-CM | POA: Diagnosis present

## 2015-05-01 LAB — BASIC METABOLIC PANEL
Anion gap: 7 (ref 5–15)
BUN: <5 mg/dL — ABNORMAL LOW (ref 6–20)
CALCIUM: 8.4 mg/dL — AB (ref 8.9–10.3)
CO2: 27 mmol/L (ref 22–32)
Chloride: 103 mmol/L (ref 101–111)
Creatinine, Ser: 0.91 mg/dL (ref 0.44–1.00)
GFR calc Af Amer: >60 mL/min (ref >60)
Glucose, Bld: 99 mg/dL (ref 65–99)
POTASSIUM: 3.5 mmol/L (ref 3.5–5.1)
Sodium: 137 mmol/L (ref 135–145)

## 2015-05-01 LAB — CBC WITH DIFFERENTIAL/PLATELET
BASOS PCT: 1 %
Basophils Absolute: 0 10*3/uL (ref 0–0.1)
EOS PCT: 0 %
Eosinophils Absolute: 0 10*3/uL (ref 0–0.7)
HCT: 27.9 % — ABNORMAL LOW (ref 35.0–47.0)
HEMOGLOBIN: 9.8 g/dL — AB (ref 12.0–16.0)
Lymphocytes Relative: 39 %
Lymphs Abs: 0.9 10*3/uL — ABNORMAL LOW (ref 1.0–3.6)
MCH: 29.8 pg (ref 26.0–34.0)
MCHC: 35 g/dL (ref 32.0–36.0)
MCV: 85.2 fL (ref 80.0–100.0)
MONOS PCT: 24 %
Monocytes Absolute: 0.6 10*3/uL (ref 0.2–0.9)
Neutro Abs: 0.9 10*3/uL — ABNORMAL LOW (ref 1.4–6.5)
Neutrophils Relative %: 36 %
PLATELETS: 293 10*3/uL (ref 150–440)
RBC: 3.27 MIL/uL — AB (ref 3.80–5.20)
RDW: 15.4 % — ABNORMAL HIGH (ref 11.5–14.5)
WBC: 2.4 10*3/uL — ABNORMAL LOW (ref 3.6–11.0)

## 2015-05-01 MED ORDER — PREDNISONE 20 MG PO TABS
20.0000 mg | ORAL_TABLET | Freq: Once | ORAL | Status: AC
  Administered 2015-05-01: 20 mg via ORAL
  Filled 2015-05-01: qty 1

## 2015-05-01 MED ORDER — POTASSIUM CHLORIDE CRYS ER 20 MEQ PO TBCR: 20.0000 meq | EXTENDED_RELEASE_TABLET | Freq: Every day | ORAL | Status: DC

## 2015-05-01 MED ORDER — ONDANSETRON HCL 4 MG PO TABS: 4.0000 mg | ORAL_TABLET | Freq: Four times a day (QID) | ORAL | Status: DC | PRN

## 2015-05-01 MED ORDER — LEVOFLOXACIN 500 MG PO TABS: 500.0000 mg | ORAL_TABLET | Freq: Every day | ORAL | Status: AC

## 2015-05-01 MED ORDER — PREDNISONE 20 MG PO TABS: 20.0000 mg | ORAL_TABLET | Freq: Every day | ORAL | Status: DC

## 2015-05-01 NOTE — Discharge Summary (Signed)
Physician Discharge Summary  Patient ID: Brittney Clark MRN: [id number] DOB/AGE: Apr 09, 1941 74 y.o.  Admit date: 04/28/2015 Discharge date: 05/01/2015  Admission Diagnoses:  Discharge Diagnoses:  Principal Problem:   Hyponatremia Active Problems:   Essential hypertension   Pneumonia   Discharged Condition: stable  Hospital Course: Admitted with hyponatremia, with CXR revealing right lower lobe pneumonia.  Started on IV fluid and antibiotics, with correction of sodium to 137 on day of discharge.  Changed to PO antibiotics 5/22 and remains afebrile, on room air.  Ambulated independently and felt close to baseline level of ambulation.  BP elevated after medication held, but back to target level when resumed (except continuing to hold diuretic due to hyponatremia).  Some scant blood in cough; repeat CXR shows no significant change.  Holding ASA and boosted prednisone; holding azathioprine for now due to infection.  Pt to call or return if increased symptoms, and she is comfortable with going home currently.      Discharge Exam: Blood pressure 137/84, pulse 76, temperature 98.3 F (36.8 C), temperature source Oral, resp. rate 18, height 5\' 5"  (1.651 m), weight 72.893 kg (160 lb 11.2 oz), SpO2 97 %.   Disposition:   Discharge Instructions    Diet - low sodium heart healthy    Complete by:  As directed      Increase activity slowly    Complete by:  As directed             Medication List    STOP taking these medications        aspirin EC 81 MG tablet     azaTHIOprine 50 MG tablet  Commonly known as:  IMURAN     calcium-vitamin D 500-200 MG-UNIT per tablet  Commonly known as:  OSCAL WITH D     doxycycline 100 MG capsule  Commonly known as:  VIBRAMYCIN      TAKE these medications        alendronate 70 MG tablet  Commonly known as:  FOSAMAX  Take 70 mg by mouth once a week. On Monday     carvedilol 25 MG tablet  Commonly known as:  COREG  Take 25 mg by mouth 2  (two) times daily with a meal.     diltiazem 240 MG 24 hr capsule  Commonly known as:  DILACOR XR  Take 240 mg by mouth daily.     docusate sodium 100 MG capsule  Commonly known as:  COLACE  Take 100 mg by mouth 2 (two) times daily.     EPINEPHrine 0.3 mg/0.3 mL Soaj injection  Commonly known as:  EPI-PEN  Inject 0.3 mg into the muscle once as needed (for allergic reaction).     fexofenadine 180 MG tablet  Commonly known as:  ALLEGRA  Take 180 mg by mouth daily as needed (before allergy injection).     isosorbide mononitrate 120 MG 24 hr tablet  Commonly known as:  IMDUR  Take 120 mg by mouth daily.     LACTOBACILLUS PO  Take 1 capsule by mouth daily.     levofloxacin 500 MG tablet  Commonly known as:  LEVAQUIN  Take 1 tablet (500 mg total) by mouth daily.     minoxidil 2.5 MG tablet  Commonly known as:  LONITEN  Take 2.5 mg by mouth 2 (two) times daily.     omeprazole 40 MG capsule  Commonly known as:  PRILOSEC  Take 40 mg by mouth daily.     ondansetron  4 MG tablet  Commonly known as:  ZOFRAN  Take 1 tablet (4 mg total) by mouth every 6 (six) hours as needed for nausea.     potassium chloride SA 20 MEQ tablet  Commonly known as:  K-DUR,KLOR-CON  Take 1 tablet (20 mEq total) by mouth daily.     PRESCRIPTION MEDICATION  Inject 1 Dose as directed once a week. Patient uses allergy shots on Tuesday         Signed: Tama High III 05/01/2015, 12:51 PM

## 2015-05-01 NOTE — Progress Notes (Signed)
Patient discharged via wheelchair. Vitals stable. Reviewed discharge instructions, prescriptions given. IV saline lock has been disconitnued. Daughter accompanied patient.

## 2015-05-01 NOTE — Progress Notes (Signed)
Physical Therapy Screen Patient Details Name: Brittney Clark MRN: [id number] DOB: Mar 25, 1941 Today's Date: 05/01/2015  Chart reviewed and RN consulted. RN reporting full independence with ambulation in room. Upon arrival pt states she has no need for PT services. Pt agreed to ambulate for therapist and demonstrates full safety and stability with ambulation without assistive device. No PT needs at this time. Order will be completed. Please enter new order if status or new needs arise.   Lyndel Safe Huprich PT, DPT   Huprich,Jason 05/01/2015, 12:28 PM

## 2015-05-01 NOTE — Progress Notes (Signed)
Patient slept well this shift. Iv fluids continue. Afebrile this shift. Continue to monitor.

## 2015-05-01 NOTE — Progress Notes (Signed)
SUBJECTIVE:  Admitted with hyponatremia, fever, with RLL pneumonia by CXR.  Feels better, though still with cough; scant blood in sputum.  Eating improved; nausea resolved.  BP up today, but several BP meds held last PM, per report.  No headache, dyspnea.  Some pain in chest wall with cough.  Sats normal.  Has h/o autoimmune hepatitis, and on chronic immunosuppression  ______________________________________________________________________  ROS: Please see HPI; remainder of complete 10 point ROS is negative   Past Medical History  Diagnosis Date  . Hypertension   . Immune deficiency disorder   . Leukopenia   . Anemia   . Venous stasis     Past Surgical History  Procedure Laterality Date  . Neck surgery    . Abdominal hysterectomy    . Breast surgery    . Appendectomy    . Rotator cuff repair       Current facility-administered medications:  .  acetaminophen (TYLENOL) tablet 650 mg, 650 mg, Oral, Q6H PRN, 650 mg at 04/30/15 1702 **OR** acetaminophen (TYLENOL) suppository 650 mg, 650 mg, Rectal, Q6H PRN, Lytle Butte, MD .  aspirin EC tablet 81 mg, 81 mg, Oral, Daily, Lytle Butte, MD, 81 mg at 04/30/15 7169 .  calcium-vitamin D (OSCAL WITH D) 500-200 MG-UNIT per tablet 1 tablet, 1 tablet, Oral, BID, Lytle Butte, MD, 1 tablet at 04/30/15 2227 .  carvedilol (COREG) tablet 25 mg, 25 mg, Oral, BID WC, Lytle Butte, MD, 25 mg at 05/01/15 0800 .  diltiazem (DILACOR XR) 24 hr capsule 240 mg, 240 mg, Oral, Daily, Lytle Butte, MD, 240 mg at 04/30/15 0940 .  docusate sodium (COLACE) capsule 100 mg, 100 mg, Oral, BID, Lytle Butte, MD, 100 mg at 04/30/15 2227 .  heparin injection 5,000 Units, 5,000 Units, Subcutaneous, 3 times per day, Lytle Butte, MD, 5,000 Units at 05/01/15 [phone] .  isosorbide mononitrate (IMDUR) 24 hr tablet 120 mg, 120 mg, Oral, Daily, Lytle Butte, MD, 120 mg at 04/30/15 3810 .  levofloxacin (LEVAQUIN) tablet 500 mg, 500 mg, Oral, Daily, Kirk Ruths,  MD, 500 mg at 04/30/15 1702 .  loratadine (CLARITIN) tablet 10 mg, 10 mg, Oral, Daily, Lytle Butte, MD, 10 mg at 04/29/15 1000 .  minoxidil (LONITEN) tablet 2.5 mg, 2.5 mg, Oral, BID, Lytle Butte, MD, 2.5 mg at 04/30/15 2227 .  morphine 2 MG/ML injection 2 mg, 2 mg, Intravenous, Q4H PRN, Lytle Butte, MD .  ondansetron [facility]) tablet 4 mg, 4 mg, Oral, Q6H PRN, 4 mg at 04/30/15 0511 **OR** ondansetron (ZOFRAN) injection 4 mg, 4 mg, Intravenous, Q6H PRN, Lytle Butte, MD, 4 mg at 04/29/15 2110 .  pantoprazole (PROTONIX) EC tablet 40 mg, 40 mg, Oral, Daily, Lytle Butte, MD, 40 mg at 04/30/15 1751 .  predniSONE (DELTASONE) tablet 5 mg, 5 mg, Oral, Q breakfast, Kirk Ruths, MD, 5 mg at 04/30/15 1200  PHYSICAL EXAM:  BP 178/102 mmHg  Pulse 76  Temp(Src) 98.3 F (36.8 C) (Oral)  Resp 18  Ht 5\' 5"  (1.651 m)  Wt 72.893 kg (160 lb 11.2 oz)  BMI 26.74 kg/m2  SpO2 97%  General: Well developed, well nourished female, in NAD HEENT: PERRL; OP moist without lesions. Neck: supple, trachea midline, no thyromegaly Chest: mild discomfort to palpation over sternum Lungs: few basilar crackles without retractions or wheezes Cardiovascular: RRR, no murmur, no gallop; distal pulses 2+ Abdomen: soft, nontender, nondistended, positive bowel sounds Extremities: no clubbing, cyanosis,  edema Neuro: alert and oriented, moves all extremities Derm: no significant rashes or nodules; good skin turgor Lymph: no cervical or supraclavicular lymphadenopathy  Labs and imaging studies were reviewed  ASSESSMENT/PLAN:   1. Pneumonia/hyponatremia- now with scant hemoptysis.  Will repeat CXR.  On PO abx and temp improving.  Mobilize pt as able unless symptoms worsen.  Sodium improved; sending urine for legionella, given pneumonia with profound hyponatremia, though recent nausea and home meds may have effected this. 2. Autoimmune hepatitis- on prednisone and will increase dose, given acute illness.  Hold  azathioprine for now, given acute infection 3. HTN- continue current medications and follow for effect.  Holding BP meds which may contribute to hyponatremia

## 2015-05-01 NOTE — Discharge Instructions (Signed)
Stop taking calcium and azathioprine until done with your antibiotic, then resume  Stop aspirin for 3 days and then resume (unless you are still seeing bleeding)  Take prednisone 20 mg daily for 3 days, then go back to your usual home dose of prednisone    Pneumonia Pneumonia is an infection of the lungs.  CAUSES Pneumonia may be caused by bacteria or a virus. Usually, these infections are caused by breathing infectious particles into the lungs (respiratory tract). SIGNS AND SYMPTOMS   Cough.  Fever.  Chest pain.  Increased rate of breathing.  Wheezing.  Mucus production. DIAGNOSIS  If you have the common symptoms of pneumonia, your health care provider will typically confirm the diagnosis with a chest X-ray. The X-ray will show an abnormality in the lung (pulmonary infiltrate) if you have pneumonia. Other tests of your blood, urine, or sputum may be done to find the specific cause of your pneumonia. Your health care provider may also do tests (blood gases or pulse oximetry) to see how well your lungs are working. TREATMENT  Some forms of pneumonia may be spread to other people when you cough or sneeze. You may be asked to wear a mask before and during your exam. Pneumonia that is caused by bacteria is treated with antibiotic medicine. Pneumonia that is caused by the influenza virus may be treated with an antiviral medicine. Most other viral infections must run their course. These infections will not respond to antibiotics.  HOME CARE INSTRUCTIONS   Cough suppressants may be used if you are losing too much rest. However, coughing protects you by clearing your lungs. You should avoid using cough suppressants if you can.  Your health care provider may have prescribed medicine if he or she thinks your pneumonia is caused by bacteria or influenza. Finish your medicine even if you start to feel better.  Your health care provider may also prescribe an expectorant. This loosens the mucus  to be coughed up.  Take medicines only as directed by your health care provider.  Do not smoke. Smoking is a common cause of bronchitis and can contribute to pneumonia. If you are a smoker and continue to smoke, your cough may last several weeks after your pneumonia has cleared.  A cold steam vaporizer or humidifier in your room or home may help loosen mucus.  Coughing is often worse at night. Sleeping in a semi-upright position in a recliner or using a couple pillows under your head will help with this.  Get rest as you feel it is needed. Your body will usually let you know when you need to rest. PREVENTION A pneumococcal shot (vaccine) is available to prevent a common bacterial cause of pneumonia. This is usually suggested for:  People over 72 years old.  Patients on chemotherapy.  People with chronic lung problems, such as bronchitis or emphysema.  People with immune system problems. If you are over 65 or have a high risk condition, you may receive the pneumococcal vaccine if you have not received it before. In some countries, a routine influenza vaccine is also recommended. This vaccine can help prevent some cases of pneumonia.You may be offered the influenza vaccine as part of your care. If you smoke, it is time to quit. You may receive instructions on how to stop smoking. Your health care provider can provide medicines and counseling to help you quit. SEEK MEDICAL CARE IF: You have a fever. SEEK IMMEDIATE MEDICAL CARE IF:   Your illness becomes worse. This  is especially true if you are elderly or weakened from any other disease.  You cannot control your cough with suppressants and are losing sleep.  You begin coughing up blood.  You develop pain which is getting worse or is uncontrolled with medicines.  Any of the symptoms which initially brought you in for treatment are getting worse rather than better.  You develop shortness of breath or chest pain. MAKE SURE YOU:    Understand these instructions.  Will watch your condition.  Will get help right away if you are not doing well or get worse. Document Released: 11/25/2005 Document Revised: 04/11/2014 Document Reviewed: 02/14/2011 [facility] Patient Information 2015 [place], [place]. This information is not intended to replace advice given to you by your health care provider. Make sure you discuss any questions you have with your health care provider.

## 2015-07-04 ENCOUNTER — Other Ambulatory Visit: Payer: Self-pay | Admitting: Physical Medicine and Rehabilitation

## 2015-07-04 DIAGNOSIS — M5416 Radiculopathy, lumbar region: Secondary | ICD-10-CM

## 2015-07-10 ENCOUNTER — Ambulatory Visit
Admission: RE | Admit: 2015-07-10 | Discharge: 2015-07-10 | Disposition: A | Discharge status: Home / Self Care | Payer: Medicare Other | Source: Ambulatory Visit | Attending: Physical Medicine and Rehabilitation | Admitting: Physical Medicine and Rehabilitation

## 2015-07-10 DIAGNOSIS — M5126 Other intervertebral disc displacement, lumbar region: Secondary | ICD-10-CM | POA: Diagnosis not present

## 2015-07-10 DIAGNOSIS — M5416 Radiculopathy, lumbar region: Secondary | ICD-10-CM | POA: Diagnosis present

## 2015-07-10 DIAGNOSIS — M4806 Spinal stenosis, lumbar region: Secondary | ICD-10-CM | POA: Insufficient documentation

## 2015-07-10 DIAGNOSIS — M5136 Other intervertebral disc degeneration, lumbar region: Secondary | ICD-10-CM | POA: Insufficient documentation

## 2015-07-10 DIAGNOSIS — M7138 Other bursal cyst, other site: Secondary | ICD-10-CM | POA: Diagnosis not present

## 2015-07-10 DIAGNOSIS — M5386 Other specified dorsopathies, lumbar region: Secondary | ICD-10-CM | POA: Insufficient documentation

## 2015-07-17 ENCOUNTER — Other Ambulatory Visit: Payer: Self-pay | Admitting: Nurse Practitioner

## 2015-07-17 DIAGNOSIS — K754 Autoimmune hepatitis: Secondary | ICD-10-CM

## 2015-07-24 ENCOUNTER — Ambulatory Visit
Admission: RE | Admit: 2015-07-24 | Discharge: 2015-07-24 | Disposition: A | Discharge status: Home / Self Care | Payer: Medicare Other | Source: Ambulatory Visit | Attending: Nurse Practitioner | Admitting: Nurse Practitioner

## 2015-07-24 DIAGNOSIS — K754 Autoimmune hepatitis: Secondary | ICD-10-CM

## 2015-07-24 DIAGNOSIS — N281 Cyst of kidney, acquired: Secondary | ICD-10-CM | POA: Insufficient documentation

## 2015-07-27 DIAGNOSIS — M5416 Radiculopathy, lumbar region: Secondary | ICD-10-CM | POA: Insufficient documentation

## 2015-07-27 DIAGNOSIS — M5136 Other intervertebral disc degeneration, lumbar region: Secondary | ICD-10-CM | POA: Insufficient documentation

## 2015-09-07 DIAGNOSIS — M4726 Other spondylosis with radiculopathy, lumbar region: Secondary | ICD-10-CM | POA: Insufficient documentation

## 2015-09-25 ENCOUNTER — Other Ambulatory Visit (HOSPITAL_COMMUNITY): Payer: Medicare Other | Admitting: Neurosurgery

## 2015-10-19 ENCOUNTER — Encounter (HOSPITAL_COMMUNITY): Payer: Self-pay

## 2015-10-19 ENCOUNTER — Encounter (HOSPITAL_COMMUNITY)
Admission: RE | Admit: 2015-10-19 | Discharge: 2015-10-19 | Disposition: A | Discharge status: Home / Self Care | Payer: Medicare Other | Source: Ambulatory Visit | Attending: Neurosurgery | Admitting: Neurosurgery

## 2015-10-19 DIAGNOSIS — M7138 Other bursal cyst, other site: Secondary | ICD-10-CM | POA: Insufficient documentation

## 2015-10-19 DIAGNOSIS — Z01818 Encounter for other preprocedural examination: Secondary | ICD-10-CM | POA: Insufficient documentation

## 2015-10-19 HISTORY — DX: Gastro-esophageal reflux disease without esophagitis: K21.9

## 2015-10-19 HISTORY — DX: Unspecified osteoarthritis, unspecified site: M19.90

## 2015-10-19 LAB — CBC
HCT: 36.6 % (ref 36.0–46.0)
Hemoglobin: 12 g/dL (ref 12.0–15.0)
MCH: 29.6 pg (ref 26.0–34.0)
MCHC: 32.8 g/dL (ref 30.0–36.0)
MCV: 90.1 fL (ref 78.0–100.0)
PLATELETS: 228 10*3/uL (ref 150–400)
RBC: 4.06 MIL/uL (ref 3.87–5.11)
RDW: 14.9 % (ref 11.5–15.5)
WBC: 6.1 10*3/uL (ref 4.0–10.5)

## 2015-10-19 LAB — SURGICAL PCR SCREEN
MRSA, PCR: NEGATIVE
Staphylococcus aureus: NEGATIVE

## 2015-10-19 LAB — BASIC METABOLIC PANEL
Anion gap: 9 (ref 5–15)
BUN: 13 mg/dL (ref 6–20)
CHLORIDE: 100 mmol/L — AB (ref 101–111)
CO2: 28 mmol/L (ref 22–32)
CREATININE: 1.08 mg/dL — AB (ref 0.44–1.00)
Calcium: 9.7 mg/dL (ref 8.9–10.3)
GFR calc Af Amer: 58 mL/min — ABNORMAL LOW (ref >60)
GFR, EST NON AFRICAN AMERICAN: 50 mL/min — AB (ref >60)
GLUCOSE: 96 mg/dL (ref 65–99)
POTASSIUM: 3.2 mmol/L — AB (ref 3.5–5.1)
Sodium: 137 mmol/L (ref 135–145)

## 2015-10-19 LAB — TYPE AND SCREEN
ABO/RH(D): O POS
Antibody Screen: NEGATIVE

## 2015-10-19 LAB — ABO/RH: ABO/RH(D): O POS

## 2015-10-19 NOTE — Progress Notes (Signed)
Pt stated she got clearance from PCP  Dr Jens Som at [facility]. Will request this and ekg

## 2015-10-19 NOTE — Pre-Procedure Instructions (Signed)
    Brittney Clark  10/19/2015      [facility] [id number] Lorina Rabon, [place] - [place] [address] [place] [address] Phone: [phone] Fax: [phone]    Your procedure is scheduled on 10/27/15.  Report to [facility] Admitting at 530 A.M.  Call this number if you have problems the morning of surgery:  [phone]   Remember:  Do not eat food or drink liquids after midnight.  Take these medicines the morning of surgery with A SIP OF WATER --carvedilol,diltizem,allegra,imdur   Do not wear jewelry, make-up or nail polish.  Do not wear lotions, powders, or perfumes.  You may wear deodorant.  Do not shave 48 hours prior to surgery.  Men may shave face and neck.  Do not bring valuables to the hospital.  [facility] is not responsible for any belongings or valuables.  Contacts, dentures or bridgework may not be worn into surgery.  Leave your suitcase in the car.  After surgery it may be brought to your room.  For patients admitted to the hospital, discharge time will be determined by your treatment team.  Patients discharged the day of surgery will not be allowed to drive home.   Name and phone number of your driver:   Special instructions:    Please read over the following fact sheets that you were given. Pain Booklet, Coughing and Deep Breathing, Blood Transfusion Information, MRSA Information and Surgical Site Infection Prevention

## 2015-10-20 NOTE — Progress Notes (Addendum)
Anesthesia Chart Review:  Pt is 74 year old female scheduled for maximum access PLIF L5-S1 with resection of synovial cyst on 10/27/2015 with Dr. Vertell Limber.   PCP is Dr. Ramonita Lab (Care everywhere).   PMH includes: HTN, anemia, leukopenia, immune deficiency disorder, autoimmune hepatitis, GERD. Never smoker. BMI 26.   Medications include: ASA, carvedilol, diltiazem, imuran, losartan, prednisone.   Preoperative labs reviewed.  LFTs from 07/27/15 (care everywhere) acceptable for surgery.   Chest x-ray 05/01/15 reviewed. Right lower lobe infiltrate consistent with pneumonia. Small right pleural effusion.  EKG 09/21/15: NSR. Nonspecific T wave abnormality.  Pt has medical clearance from Dr. Caryl Comes in Care everywhere note dated 09/21/15. Dr. Caryl Comes notes: "Her immunosuppressants create a challenge; she reports orthopedics has recommended that she be off azathioprine to reduce risk of infection and improve wound healing. She is on prednisone, and would need to be on stress dosing after procedure. She should be adequately protected in terms of autoimmune hepatitis with this higher dose of prednisone while off imuran, and so it should be reasonably acceptably safe to have her off the Imuran for the surgery. She did come off Imuran while she had infection earlier this year, and she did not show evidence of worsening liver disease. She is aware that many anesthetics, however, are processed through the liver, and would recommend monitoring liver enzymes postoperatively.  Willeen Cass, FNP-BC [facility] Short Stay Surgical Center/Anesthesiology Phone: [phone] 10/20/2015 4:45 PM  Addendum:   Cardiac cath 05/29/07 [facility]): - there is insignificant CAD (osital CX 25% stenosis). - LV function is normal. EF 65%  If no changes, I anticipate pt can proceed with surgery as scheduled.   Willeen Cass, FNP-BC [facility] Short Stay Surgical Center/Anesthesiology Phone: [phone] 10/26/2015 1:42 PM

## 2015-10-26 MED ORDER — VANCOMYCIN HCL IN DEXTROSE 1-5 GM/200ML-% IV SOLN
1000.0000 mg | INTRAVENOUS | Status: AC
  Administered 2015-10-27: 1000 mg via INTRAVENOUS
  Filled 2015-10-26: qty 200

## 2015-10-27 ENCOUNTER — Inpatient Hospital Stay (HOSPITAL_COMMUNITY): Payer: Medicare Other

## 2015-10-27 ENCOUNTER — Inpatient Hospital Stay (HOSPITAL_COMMUNITY): Payer: Medicare Other | Admitting: Certified Registered Nurse Anesthetist

## 2015-10-27 ENCOUNTER — Inpatient Hospital Stay (HOSPITAL_COMMUNITY): Payer: Medicare Other | Admitting: Emergency Medicine

## 2015-10-27 ENCOUNTER — Inpatient Hospital Stay (HOSPITAL_COMMUNITY)
Admission: RE | Admit: 2015-10-27 | Discharge: 2015-10-28 | DRG: 460 | Disposition: A | Discharge status: Home / Self Care | Payer: Medicare Other | Source: Ambulatory Visit | Attending: Neurosurgery | Admitting: Neurosurgery

## 2015-10-27 ENCOUNTER — Encounter (HOSPITAL_COMMUNITY): Payer: Self-pay | Admitting: *Deleted

## 2015-10-27 ENCOUNTER — Encounter (HOSPITAL_COMMUNITY)
Admission: RE | Disposition: A | Discharge status: Home / Self Care | Payer: Self-pay | Source: Ambulatory Visit | Attending: Neurosurgery

## 2015-10-27 DIAGNOSIS — Z7952 Long term (current) use of systemic steroids: Secondary | ICD-10-CM

## 2015-10-27 DIAGNOSIS — M4807 Spinal stenosis, lumbosacral region: Secondary | ICD-10-CM | POA: Diagnosis present

## 2015-10-27 DIAGNOSIS — M4316 Spondylolisthesis, lumbar region: Secondary | ICD-10-CM | POA: Diagnosis present

## 2015-10-27 DIAGNOSIS — M7138 Other bursal cyst, other site: Secondary | ICD-10-CM | POA: Diagnosis present

## 2015-10-27 DIAGNOSIS — M4727 Other spondylosis with radiculopathy, lumbosacral region: Secondary | ICD-10-CM | POA: Diagnosis present

## 2015-10-27 DIAGNOSIS — M4317 Spondylolisthesis, lumbosacral region: Secondary | ICD-10-CM | POA: Diagnosis not present

## 2015-10-27 DIAGNOSIS — K754 Autoimmune hepatitis: Secondary | ICD-10-CM | POA: Diagnosis present

## 2015-10-27 DIAGNOSIS — I10 Essential (primary) hypertension: Secondary | ICD-10-CM | POA: Diagnosis not present

## 2015-10-27 DIAGNOSIS — M810 Age-related osteoporosis without current pathological fracture: Secondary | ICD-10-CM | POA: Diagnosis present

## 2015-10-27 DIAGNOSIS — M79604 Pain in right leg: Secondary | ICD-10-CM | POA: Diagnosis present

## 2015-10-27 DIAGNOSIS — Z419 Encounter for procedure for purposes other than remedying health state, unspecified: Secondary | ICD-10-CM

## 2015-10-27 HISTORY — PX: MAXIMUM ACCESS (MAS)POSTERIOR LUMBAR INTERBODY FUSION (PLIF) 1 LEVEL: SHX6368

## 2015-10-27 SURGERY — FOR MAXIMUM ACCESS (MAS) POSTERIOR LUMBAR INTERBODY FUSION (PLIF) 1 LEVEL
Anesthesia: General | Site: Back

## 2015-10-27 MED ORDER — DEXAMETHASONE SODIUM PHOSPHATE 10 MG/ML IJ SOLN
INTRAMUSCULAR | Status: DC | PRN
  Administered 2015-10-27: 10 mg via INTRAVENOUS

## 2015-10-27 MED ORDER — LACTATED RINGERS IV SOLN
INTRAVENOUS | Status: DC | PRN
  Administered 2015-10-27 (×2): via INTRAVENOUS

## 2015-10-27 MED ORDER — CALCIUM CARBONATE 1250 (500 CA) MG PO TABS
1.0000 | ORAL_TABLET | Freq: Every day | ORAL | Status: DC
  Administered 2015-10-27 – 2015-10-28 (×2): 500 mg via ORAL
  Filled 2015-10-27 (×3): qty 1

## 2015-10-27 MED ORDER — ISOSORBIDE MONONITRATE ER 60 MG PO TB24
120.0000 mg | ORAL_TABLET | Freq: Every day | ORAL | Status: DC
  Filled 2015-10-27 (×2): qty 2

## 2015-10-27 MED ORDER — EPHEDRINE SULFATE 50 MG/ML IJ SOLN
INTRAMUSCULAR | Status: DC | PRN
  Administered 2015-10-27: 10 mg via INTRAVENOUS
  Administered 2015-10-27: 5 mg via INTRAVENOUS

## 2015-10-27 MED ORDER — PROMETHAZINE HCL 25 MG/ML IJ SOLN: 6.2500 mg | INTRAMUSCULAR | Status: DC | PRN

## 2015-10-27 MED ORDER — MEPERIDINE HCL 25 MG/ML IJ SOLN: 6.2500 mg | INTRAMUSCULAR | Status: DC | PRN

## 2015-10-27 MED ORDER — PANTOPRAZOLE SODIUM 40 MG IV SOLR
40.0000 mg | Freq: Every day | INTRAVENOUS | Status: DC
  Filled 2015-10-27: qty 40

## 2015-10-27 MED ORDER — HYDROCODONE-ACETAMINOPHEN 7.5-325 MG PO TABS
1.0000 | ORAL_TABLET | ORAL | Status: DC | PRN
  Administered 2015-10-27 – 2015-10-28 (×2): 2 via ORAL
  Filled 2015-10-27 (×2): qty 2

## 2015-10-27 MED ORDER — MIDAZOLAM HCL 5 MG/5ML IJ SOLN
INTRAMUSCULAR | Status: DC | PRN
  Administered 2015-10-27: 2 mg via INTRAVENOUS

## 2015-10-27 MED ORDER — FENTANYL CITRATE (PF) 100 MCG/2ML IJ SOLN
INTRAMUSCULAR | Status: DC | PRN
  Administered 2015-10-27 (×3): 50 ug via INTRAVENOUS

## 2015-10-27 MED ORDER — OXYCODONE-ACETAMINOPHEN 5-325 MG PO TABS: 1.0000 | ORAL_TABLET | ORAL | Status: DC | PRN

## 2015-10-27 MED ORDER — DOCUSATE SODIUM 100 MG PO CAPS: 100.0000 mg | ORAL_CAPSULE | Freq: Two times a day (BID) | ORAL | Status: DC

## 2015-10-27 MED ORDER — DOCUSATE SODIUM 100 MG PO CAPS
100.0000 mg | ORAL_CAPSULE | Freq: Two times a day (BID) | ORAL | Status: DC
  Administered 2015-10-27 – 2015-10-28 (×2): 100 mg via ORAL
  Filled 2015-10-27 (×2): qty 1

## 2015-10-27 MED ORDER — SUCCINYLCHOLINE CHLORIDE 20 MG/ML IJ SOLN
INTRAMUSCULAR | Status: DC | PRN
  Administered 2015-10-27: 100 mg via INTRAVENOUS

## 2015-10-27 MED ORDER — ACETAMINOPHEN 325 MG PO TABS: 650.0000 mg | ORAL_TABLET | ORAL | Status: DC | PRN

## 2015-10-27 MED ORDER — ASPIRIN EC 81 MG PO TBEC
81.0000 mg | DELAYED_RELEASE_TABLET | Freq: Every day | ORAL | Status: DC
  Administered 2015-10-28: 81 mg via ORAL
  Filled 2015-10-27: qty 1

## 2015-10-27 MED ORDER — GLYCOPYRROLATE 0.2 MG/ML IJ SOLN
INTRAMUSCULAR | Status: AC
  Filled 2015-10-27: qty 2

## 2015-10-27 MED ORDER — CYANOCOBALAMIN 250 MCG PO TABS
500.0000 ug | ORAL_TABLET | Freq: Every day | ORAL | Status: DC
  Administered 2015-10-27 – 2015-10-28 (×2): 500 ug via ORAL
  Filled 2015-10-27 (×3): qty 2

## 2015-10-27 MED ORDER — HYDROCODONE-ACETAMINOPHEN 5-325 MG PO TABS
ORAL_TABLET | ORAL | Status: AC
  Filled 2015-10-27: qty 2

## 2015-10-27 MED ORDER — HYDROMORPHONE HCL 1 MG/ML IJ SOLN
0.2500 mg | INTRAMUSCULAR | Status: DC | PRN
  Administered 2015-10-27: 0.5 mg via INTRAVENOUS

## 2015-10-27 MED ORDER — ARTIFICIAL TEARS OP OINT
TOPICAL_OINTMENT | OPHTHALMIC | Status: AC
  Filled 2015-10-27: qty 3.5

## 2015-10-27 MED ORDER — BUPIVACAINE HCL (PF) 0.5 % IJ SOLN
INTRAMUSCULAR | Status: DC | PRN
  Administered 2015-10-27: 5 mL

## 2015-10-27 MED ORDER — SODIUM CHLORIDE 0.9 % IJ SOLN
3.0000 mL | Freq: Two times a day (BID) | INTRAMUSCULAR | Status: DC
  Administered 2015-10-27 (×2): 3 mL via INTRAVENOUS

## 2015-10-27 MED ORDER — METHOCARBAMOL 1000 MG/10ML IJ SOLN
500.0000 mg | Freq: Four times a day (QID) | INTRAMUSCULAR | Status: DC | PRN
  Filled 2015-10-27: qty 5

## 2015-10-27 MED ORDER — HYDROCODONE-ACETAMINOPHEN 5-325 MG PO TABS
1.0000 | ORAL_TABLET | ORAL | Status: DC | PRN
  Administered 2015-10-27 (×2): 2 via ORAL
  Filled 2015-10-27: qty 2

## 2015-10-27 MED ORDER — LORATADINE 10 MG PO TABS
10.0000 mg | ORAL_TABLET | Freq: Every day | ORAL | Status: DC
  Administered 2015-10-28: 10 mg via ORAL
  Filled 2015-10-27: qty 1

## 2015-10-27 MED ORDER — BISACODYL 10 MG RE SUPP: 10.0000 mg | Freq: Every day | RECTAL | Status: DC | PRN

## 2015-10-27 MED ORDER — LIDOCAINE-EPINEPHRINE 1 %-1:100000 IJ SOLN
INTRAMUSCULAR | Status: DC | PRN
  Administered 2015-10-27: 5 mL

## 2015-10-27 MED ORDER — EPINEPHRINE 0.3 MG/0.3ML IJ SOAJ: 0.3000 mg | Freq: Once | INTRAMUSCULAR | Status: DC | PRN

## 2015-10-27 MED ORDER — EPHEDRINE SULFATE 50 MG/ML IJ SOLN
INTRAMUSCULAR | Status: AC
  Filled 2015-10-27: qty 1

## 2015-10-27 MED ORDER — PREDNISONE 5 MG PO TABS
5.0000 mg | ORAL_TABLET | Freq: Every day | ORAL | Status: DC
  Administered 2015-10-28: 5 mg via ORAL
  Filled 2015-10-27 (×2): qty 1

## 2015-10-27 MED ORDER — BUPIVACAINE LIPOSOME 1.3 % IJ SUSP
20.0000 mL | INTRAMUSCULAR | Status: DC
  Filled 2015-10-27: qty 20

## 2015-10-27 MED ORDER — PROPOFOL 10 MG/ML IV BOLUS
INTRAVENOUS | Status: AC
  Filled 2015-10-27: qty 20

## 2015-10-27 MED ORDER — LOSARTAN POTASSIUM 50 MG PO TABS
50.0000 mg | ORAL_TABLET | Freq: Every day | ORAL | Status: DC
Start: 2015-10-27 — End: 2015-10-28
  Administered 2015-10-27 – 2015-10-28 (×2): 50 mg via ORAL
  Filled 2015-10-27 (×2): qty 1

## 2015-10-27 MED ORDER — MENTHOL 3 MG MT LOZG: 1.0000 | LOZENGE | OROMUCOSAL | Status: DC | PRN

## 2015-10-27 MED ORDER — 0.9 % SODIUM CHLORIDE (POUR BTL) OPTIME
TOPICAL | Status: DC | PRN
  Administered 2015-10-27: 1000 mL

## 2015-10-27 MED ORDER — PHENOL 1.4 % MT LIQD: 1.0000 | OROMUCOSAL | Status: DC | PRN

## 2015-10-27 MED ORDER — SODIUM CHLORIDE 0.9 % IJ SOLN
3.0000 mL | INTRAMUSCULAR | Status: DC | PRN
Start: 2015-10-27 — End: 2015-10-28

## 2015-10-27 MED ORDER — FENTANYL CITRATE (PF) 250 MCG/5ML IJ SOLN
INTRAMUSCULAR | Status: AC
  Filled 2015-10-27: qty 5

## 2015-10-27 MED ORDER — DILTIAZEM HCL ER 240 MG PO CP24
240.0000 mg | ORAL_CAPSULE | Freq: Every day | ORAL | Status: DC
Start: 2015-10-28 — End: 2015-10-28
  Administered 2015-10-28: 240 mg via ORAL
  Filled 2015-10-27 (×2): qty 1

## 2015-10-27 MED ORDER — POLYETHYLENE GLYCOL 3350 17 G PO PACK: 17.0000 g | PACK | Freq: Every day | ORAL | Status: DC | PRN

## 2015-10-27 MED ORDER — GLYCOPYRROLATE 0.2 MG/ML IJ SOLN
INTRAMUSCULAR | Status: DC | PRN
  Administered 2015-10-27: 0.4 mg via INTRAVENOUS

## 2015-10-27 MED ORDER — MIDAZOLAM HCL 2 MG/2ML IJ SOLN
INTRAMUSCULAR | Status: AC
  Filled 2015-10-27: qty 2

## 2015-10-27 MED ORDER — ROCURONIUM BROMIDE 50 MG/5ML IV SOLN
INTRAVENOUS | Status: AC
  Filled 2015-10-27: qty 1

## 2015-10-27 MED ORDER — VANCOMYCIN HCL IN DEXTROSE 1-5 GM/200ML-% IV SOLN
1000.0000 mg | Freq: Once | INTRAVENOUS | Status: AC
  Administered 2015-10-28: 1000 mg via INTRAVENOUS
  Filled 2015-10-27: qty 200

## 2015-10-27 MED ORDER — PHENYLEPHRINE HCL 10 MG/ML IJ SOLN
10.0000 mg | INTRAMUSCULAR | Status: DC | PRN
  Administered 2015-10-27: 10 ug/min via INTRAVENOUS

## 2015-10-27 MED ORDER — METHOCARBAMOL 500 MG PO TABS
500.0000 mg | ORAL_TABLET | Freq: Four times a day (QID) | ORAL | Status: DC | PRN
  Administered 2015-10-27 – 2015-10-28 (×2): 500 mg via ORAL
  Filled 2015-10-27 (×2): qty 1

## 2015-10-27 MED ORDER — ARTIFICIAL TEARS OP OINT
TOPICAL_OINTMENT | OPHTHALMIC | Status: DC | PRN
  Administered 2015-10-27: 1 via OPHTHALMIC

## 2015-10-27 MED ORDER — SODIUM CHLORIDE 0.9 % IJ SOLN
INTRAMUSCULAR | Status: AC
  Filled 2015-10-27: qty 10

## 2015-10-27 MED ORDER — HYDROMORPHONE HCL 1 MG/ML IJ SOLN: 0.5000 mg | INTRAMUSCULAR | Status: DC | PRN

## 2015-10-27 MED ORDER — HYDROMORPHONE HCL 1 MG/ML IJ SOLN
INTRAMUSCULAR | Status: AC
  Filled 2015-10-27: qty 1

## 2015-10-27 MED ORDER — AZATHIOPRINE 50 MG PO TABS
50.0000 mg | ORAL_TABLET | Freq: Every day | ORAL | Status: DC
  Administered 2015-10-27 – 2015-10-28 (×2): 50 mg via ORAL
  Filled 2015-10-27 (×3): qty 1

## 2015-10-27 MED ORDER — THROMBIN 20000 UNITS EX SOLR
CUTANEOUS | Status: DC | PRN
  Administered 2015-10-27: 10 mL via TOPICAL

## 2015-10-27 MED ORDER — ACETAMINOPHEN 650 MG RE SUPP: 650.0000 mg | RECTAL | Status: DC | PRN

## 2015-10-27 MED ORDER — CARVEDILOL 6.25 MG PO TABS
25.0000 mg | ORAL_TABLET | Freq: Two times a day (BID) | ORAL | Status: DC
  Administered 2015-10-27 – 2015-10-28 (×2): 25 mg via ORAL
  Filled 2015-10-27 (×3): qty 4

## 2015-10-27 MED ORDER — BUPIVACAINE LIPOSOME 1.3 % IJ SUSP
INTRAMUSCULAR | Status: DC | PRN
  Administered 2015-10-27: 20 mL

## 2015-10-27 MED ORDER — ONDANSETRON HCL 4 MG/2ML IJ SOLN: 4.0000 mg | INTRAMUSCULAR | Status: DC | PRN

## 2015-10-27 MED ORDER — LIDOCAINE HCL (CARDIAC) 20 MG/ML IV SOLN
INTRAVENOUS | Status: DC | PRN
  Administered 2015-10-27: 80 mg via INTRAVENOUS

## 2015-10-27 MED ORDER — PROPOFOL 10 MG/ML IV BOLUS
INTRAVENOUS | Status: DC | PRN
  Administered 2015-10-27: 20 mg via INTRAVENOUS
  Administered 2015-10-27: 150 mg via INTRAVENOUS

## 2015-10-27 MED ORDER — ONDANSETRON HCL 4 MG/2ML IJ SOLN
INTRAMUSCULAR | Status: DC | PRN
Start: 2015-10-27 — End: 2015-10-27
  Administered 2015-10-27: 4 mg via INTRAVENOUS

## 2015-10-27 MED ORDER — ALUM & MAG HYDROXIDE-SIMETH 200-200-20 MG/5ML PO SUSP: 30.0000 mL | Freq: Four times a day (QID) | ORAL | Status: DC | PRN

## 2015-10-27 MED ORDER — KCL IN DEXTROSE-NACL 20-5-0.45 MEQ/L-%-% IV SOLN
INTRAVENOUS | Status: DC
  Filled 2015-10-27 (×3): qty 1000

## 2015-10-27 MED ORDER — FLEET ENEMA 7-19 GM/118ML RE ENEM: 1.0000 | ENEMA | Freq: Once | RECTAL | Status: DC | PRN

## 2015-10-27 MED ORDER — LIDOCAINE HCL (CARDIAC) 20 MG/ML IV SOLN
INTRAVENOUS | Status: AC
  Filled 2015-10-27: qty 5

## 2015-10-27 MED ORDER — ONDANSETRON HCL 4 MG/2ML IJ SOLN
INTRAMUSCULAR | Status: AC
  Filled 2015-10-27: qty 2

## 2015-10-27 MED ORDER — ALENDRONATE SODIUM 70 MG PO TABS: 70.0000 mg | ORAL_TABLET | ORAL | Status: DC

## 2015-10-27 MED ORDER — PROPOFOL 500 MG/50ML IV EMUL
INTRAVENOUS | Status: DC | PRN
  Administered 2015-10-27: 50 ug/kg/min via INTRAVENOUS

## 2015-10-27 SURGICAL SUPPLY — 93 items
ADH SKN CLS APL DERMABOND .7 (GAUZE/BANDAGES/DRESSINGS) ×1
APL SKNCLS STERI-STRIP NONHPOA (GAUZE/BANDAGES/DRESSINGS)
BENZOIN TINCTURE PRP APPL 2/3 (GAUZE/BANDAGES/DRESSINGS) ×1 IMPLANT
BIT DRILL PLIF MAS 5.0MM DISP (DRILL) IMPLANT
BLADE CLIPPER SURG (BLADE) IMPLANT
BONE CANC CHIPS 20CC PCAN1/4 (Bone Implant) ×2 IMPLANT
BUR MATCHSTICK NEURO 3.0 LAGG (BURR) ×2 IMPLANT
BUR ROUND FLUTED 5 RND (BURR) ×2 IMPLANT
CANISTER SUCT 3000ML PPV (MISCELLANEOUS) ×2 IMPLANT
CHIPS CANC BONE 20CC PCAN1/4 (Bone Implant) ×1 IMPLANT
CLIP NEUROVISION LG (CLIP) ×1 IMPLANT
CONT SPEC 4OZ CLIKSEAL STRL BL (MISCELLANEOUS) ×2 IMPLANT
COVER BACK TABLE 24X17X13 BIG (DRAPES) IMPLANT
COVER BACK TABLE 60X90IN (DRAPES) ×2 IMPLANT
DECANTER SPIKE VIAL GLASS SM (MISCELLANEOUS) ×2 IMPLANT
DERMABOND ADVANCED (GAUZE/BANDAGES/DRESSINGS) ×1
DERMABOND ADVANCED .7 DNX12 (GAUZE/BANDAGES/DRESSINGS) ×1 IMPLANT
DRAPE C-ARM 42X72 X-RAY (DRAPES) ×2 IMPLANT
DRAPE C-ARMOR (DRAPES) ×2 IMPLANT
DRAPE LAPAROTOMY 100X72X124 (DRAPES) ×2 IMPLANT
DRAPE POUCH INSTRU U-SHP 10X18 (DRAPES) ×2 IMPLANT
DRAPE SURG 17X23 STRL (DRAPES) ×2 IMPLANT
DRILL PLIF MAS 5.0MM DISP (DRILL) ×2
DRSG OPSITE POSTOP 3X4 (GAUZE/BANDAGES/DRESSINGS) ×1 IMPLANT
DURAPREP 26ML APPLICATOR (WOUND CARE) ×2 IMPLANT
ELECT BLADE 4.0 EZ CLEAN MEGAD (MISCELLANEOUS) ×2
ELECT REM PT RETURN 9FT ADLT (ELECTROSURGICAL) ×2
ELECTRODE BLDE 4.0 EZ CLN MEGD (MISCELLANEOUS) IMPLANT
ELECTRODE REM PT RTRN 9FT ADLT (ELECTROSURGICAL) ×1 IMPLANT
EVACUATOR 1/8 PVC DRAIN (DRAIN) ×1 IMPLANT
GAUZE SPONGE 4X4 12PLY STRL (GAUZE/BANDAGES/DRESSINGS) ×1 IMPLANT
GAUZE SPONGE 4X4 16PLY XRAY LF (GAUZE/BANDAGES/DRESSINGS) IMPLANT
GLOVE BIO SURGEON STRL SZ8 (GLOVE) ×3 IMPLANT
GLOVE BIOGEL PI IND STRL 7.0 (GLOVE) IMPLANT
GLOVE BIOGEL PI IND STRL 8 (GLOVE) ×1 IMPLANT
GLOVE BIOGEL PI IND STRL 8.5 (GLOVE) ×1 IMPLANT
GLOVE BIOGEL PI INDICATOR 7.0 (GLOVE) ×1
GLOVE BIOGEL PI INDICATOR 8 (GLOVE) ×4
GLOVE BIOGEL PI INDICATOR 8.5 (GLOVE) ×2
GLOVE ECLIPSE 7.5 STRL STRAW (GLOVE) ×4 IMPLANT
GLOVE ECLIPSE 8.0 STRL XLNG CF (GLOVE) ×3 IMPLANT
GLOVE EXAM NITRILE LRG STRL (GLOVE) IMPLANT
GLOVE EXAM NITRILE MD LF STRL (GLOVE) IMPLANT
GLOVE EXAM NITRILE XL STR (GLOVE) ×2 IMPLANT
GLOVE EXAM NITRILE XS STR PU (GLOVE) IMPLANT
GOWN STRL REUS W/ TWL LRG LVL3 (GOWN DISPOSABLE) IMPLANT
GOWN STRL REUS W/ TWL XL LVL3 (GOWN DISPOSABLE) ×1 IMPLANT
GOWN STRL REUS W/TWL 2XL LVL3 (GOWN DISPOSABLE) ×4 IMPLANT
GOWN STRL REUS W/TWL LRG LVL3 (GOWN DISPOSABLE)
GOWN STRL REUS W/TWL XL LVL3 (GOWN DISPOSABLE) ×4
GRAFT BNE CANC CHIPS 1-8 20CC (Bone Implant) IMPLANT
KIT BASIN OR (CUSTOM PROCEDURE TRAY) ×2 IMPLANT
KIT INFUSE SMALL (Orthopedic Implant) ×1 IMPLANT
KIT POSITION SURG JACKSON T1 (MISCELLANEOUS) ×2 IMPLANT
KIT ROOM TURNOVER OR (KITS) ×2 IMPLANT
MILL MEDIUM DISP (BLADE) ×2 IMPLANT
MODULE NVM5 NEXT GEN EMG (NEEDLE) ×1 IMPLANT
NDL HYPO 25X1 1.5 SAFETY (NEEDLE) ×1 IMPLANT
NDL SPNL 18GX3.5 QUINCKE PK (NEEDLE) IMPLANT
NEEDLE HYPO 25X1 1.5 SAFETY (NEEDLE) ×2 IMPLANT
NEEDLE SPNL 18GX3.5 QUINCKE PK (NEEDLE) IMPLANT
NS IRRIG 1000ML POUR BTL (IV SOLUTION) ×2 IMPLANT
PACK FOAM VITOSS 10CC (Orthopedic Implant) ×1 IMPLANT
PACK LAMINECTOMY NEURO (CUSTOM PROCEDURE TRAY) ×2 IMPLANT
PAD ARMBOARD 7.5X6 YLW CONV (MISCELLANEOUS) ×6 IMPLANT
PATTIES SURGICAL .5 X.5 (GAUZE/BANDAGES/DRESSINGS) IMPLANT
PATTIES SURGICAL .5 X1 (DISPOSABLE) IMPLANT
PATTIES SURGICAL 1X1 (DISPOSABLE) IMPLANT
ROD 30MM (Rod) ×1 IMPLANT
ROD 35MM (Rod) ×1 IMPLANT
ROD PLIF MAS PB SPHERX 30 (Rod) IMPLANT
SCREW LOCK (Screw) ×8 IMPLANT
SCREW LOCK FXNS SPNE MAS PL (Screw) IMPLANT
SCREW MAS PLIF 5.5X30 (Screw) ×1 IMPLANT
SCREW PAS PLIF 5X30 (Screw) IMPLANT
SCREW PLIF MAS 5.0X35 (Screw) ×1 IMPLANT
SCREW SHANK 5.0X30MM (Screw) ×2 IMPLANT
SCREW TULIP 5.5 (Screw) ×2 IMPLANT
SPONGE LAP 4X18 X RAY DECT (DISPOSABLE) IMPLANT
SPONGE SURGIFOAM ABS GEL 100 (HEMOSTASIS) ×2 IMPLANT
STAPLER SKIN PROX WIDE 3.9 (STAPLE) IMPLANT
STRIP CLOSURE SKIN 1/2X4 (GAUZE/BANDAGES/DRESSINGS) ×1 IMPLANT
SUT VIC AB 1 CT1 18XBRD ANBCTR (SUTURE) ×2 IMPLANT
SUT VIC AB 1 CT1 8-18 (SUTURE) ×2
SUT VIC AB 2-0 CT1 18 (SUTURE) ×3 IMPLANT
SUT VIC AB 3-0 SH 8-18 (SUTURE) ×3 IMPLANT
SYR 20CC LL (SYRINGE) ×1 IMPLANT
SYR 5ML LL (SYRINGE) IMPLANT
TOWEL OR 17X24 6PK STRL BLUE (TOWEL DISPOSABLE) ×2 IMPLANT
TOWEL OR 17X26 10 PK STRL BLUE (TOWEL DISPOSABLE) ×2 IMPLANT
TRAP SPECIMEN MUCOUS 40CC (MISCELLANEOUS) ×2 IMPLANT
TRAY FOLEY W/METER SILVER 14FR (SET/KITS/TRAYS/PACK) ×2 IMPLANT
WATER STERILE IRR 1000ML POUR (IV SOLUTION) ×2 IMPLANT

## 2015-10-27 NOTE — Transfer of Care (Signed)
Immediate Anesthesia Transfer of Care Note  Patient: Brittney Clark  Procedure(s) Performed: Procedure(s): FOR MAXIMUM ACCESS (MAS) POSTERIOR LUMBAR INTERBODY FUSION (PLIF) L5-S1 MAS with resection of synovial cyst (N/A)  Patient Location: PACU  Anesthesia Type:General  Level of Consciousness: patient cooperative and responds to stimulation  Airway & Oxygen Therapy: Patient Spontanous Breathing and Patient connected to face mask oxygen  Post-op Assessment: Report given to RN and Post -op Vital signs reviewed and stable  Post vital signs: Reviewed and stable  Last Vitals:  Filed Vitals:   10/27/15 1032  BP: 126/84  Pulse: 69  Temp: 36.1 C  Resp: 33    Complications: No apparent anesthesia complications

## 2015-10-27 NOTE — Interval H&P Note (Signed)
History and Physical Interval Note:  10/27/2015 7:58 AM  Brittney Clark  has presented today for surgery, with the diagnosis of Synovial cyst  The various methods of treatment have been discussed with the patient and family. After consideration of risks, benefits and other options for treatment, the patient has consented to  Procedure(s): FOR MAXIMUM ACCESS (MAS) POSTERIOR LUMBAR INTERBODY FUSION (PLIF) L5-S1 MAS with resection of synovial cyst (N/A) as a surgical intervention .  The patient's history has been reviewed, patient examined, no change in status, stable for surgery.  I have reviewed the patient's chart and labs.  Questions were answered to the patient's satisfaction.     Ajit Errico D

## 2015-10-27 NOTE — Anesthesia Preprocedure Evaluation (Addendum)
Anesthesia Evaluation  Patient identified by MRN, date of birth, ID band Patient awake    Reviewed: Allergy & Precautions, NPO status , Patient's Chart, lab work & pertinent test results  Airway Mallampati: II  TM Distance: >3 FB Neck ROM: Full    Dental no notable dental hx.    Pulmonary pneumonia,    Pulmonary exam normal breath sounds clear to auscultation       Cardiovascular hypertension, Pt. on medications Normal cardiovascular exam Rhythm:Regular Rate:Normal     Neuro/Psych negative neurological ROS  negative psych ROS   GI/Hepatic Neg liver ROS, GERD  ,  Endo/Other  negative endocrine ROS  Renal/GU negative Renal ROS     Musculoskeletal  (+) Arthritis ,   Abdominal   Peds  Hematology  (+) anemia ,   Anesthesia Other Findings   Reproductive/Obstetrics negative OB ROS                             Anesthesia Physical Anesthesia Plan  ASA: II  Anesthesia Plan: General   Post-op Pain Management:    Induction: Intravenous  Airway Management Planned: Oral ETT  Additional Equipment:   Intra-op Plan:   Post-operative Plan: Extubation in OR  Informed Consent: I have reviewed the patients History and Physical, chart, labs and discussed the procedure including the risks, benefits and alternatives for the proposed anesthesia with the patient or authorized representative who has indicated his/her understanding and acceptance.   Dental advisory given  Plan Discussed with: CRNA  Anesthesia Plan Comments:        Anesthesia Quick Evaluation

## 2015-10-27 NOTE — Op Note (Signed)
10/27/2015  10:27 AM  PATIENT:  Brittney Clark  74 y.o. female  PRE-OPERATIVE DIAGNOSIS:  Spondylolisthesis, Synovial cyst, Stenosis, radiculopathy L 5 S 1  POST-OPERATIVE DIAGNOSIS:   Spondylolisthesis, Synovial cyst, Stenosis, radiculopathy L 5 S 1  PROCEDURE:  Procedure(s): FOR MAXIMUM ACCESS (MAS) POSTERIOR  L5-S1 MAS with resection of synovial cyst (N/A), pedicle screw fixation and posterolateral arthrodesis  SURGEON:  Surgeon(s) and Role:    * Erline Levine, MD - Primary  PHYSICIAN ASSISTANT:   ASSISTANTS: [place], RN   ANESTHESIA:   general  EBL:  Total I/O In: 1000 [I.V.:1000] Out: 110 [Urine:60; Blood:50]  BLOOD ADMINISTERED:none  DRAINS: none   LOCAL MEDICATIONS USED:  MARCAINE    and LIDOCAINE   SPECIMEN:  No Specimen  DISPOSITION OF SPECIMEN:  N/A  COUNTS:  YES  TOURNIQUET:  * No tourniquets in log *  DICTATION: Patient is a 74 year old with spondylosis , stenosis, spondylolisthesis, synovial cyst and severe back and right lower extremity pain at L 5 S 1 levels of the lumbar spine. It was elected to take her to surgery for MASPLIF L 5 S 1 level with posterolateral arthrodesis.  Because of autoimmune hepatitis and long-term steroids, I informed patient that we may not proceed with interbody prostheses if I determined intra-operatively that she had poor bone quality.  Procedure:   Following uncomplicated induction of GETA, and placement of electrodes for neural monitoring, patient was turned into a prone position on the [place] tableand using AP  fluoroscopy the area of planned incision was marked, prepped with betadine scrub and Duraprep, then draped. Exposure was performed of facet joint complex at L 5 S 1 level and the MAS retractor was placed.5.5 x 30 mm cortical Nuvasive screws were placed at L 5 bilaterally according to standard landmarks using neural monitoring.  A total laminectomy of L 5 was then performed with disarticulation of facets.  This bone was  saved for grafting, combined with allograft chips after being run through bone mill and was placed in bone packing device.  The bone was felt to bee of poor quality and consequently, I elected not to perform discectomy and interbody grafting.   The synovial cyst was identified and carefully reomved from the neural elements, including the right L 5 and S 1 nerve roots, which were significantly compressed.  This was done under Loupe magnification and there was no violation of the dura.  Remaining screws were placed at S 1 (5.5 x 30 R and 5.5 x 35 L) and 35 mm rod was placed Right and 30 mm rod left.   And the screws were locked and torqued.Final Xrays showed well positioned screw fixation. The posterolateral region was packed with small BMP, Vitoss soaked in marrow rich blood aspirated from the pedicles and 20 cc of autograft on both sides of midline. The wounds were irrigated and then closed with 1, 2-0 and 3-0 Vicryl stitches. Long-acting Marcaine was infiltrated into the deep musculature.  Sterile occlusive dressing was placed with Dermabond and an occlusive dressing. The patient was then extubated in the operating room and taken to recovery in stable and satisfactory condition having tolerated her operation well. Counts were correct at the end of the case.  PLAN OF CARE: Admit to inpatient   PATIENT DISPOSITION:  PACU - hemodynamically stable.   Delay start of Pharmacological VTE agent (>24hrs) due to surgical blood loss or risk of bleeding: yes

## 2015-10-27 NOTE — H&P (Signed)
Patient ID:   [id number] Patient: Brittney Clark  Date of Birth: Mar 03, 1941 Visit Type: Office Visit   Date: 09/20/2015 08:30 AM Provider: Marchia Meiers. Vertell Limber MD   This 74 year old female presents for Leg pain.  History of Present Illness: 1.  Leg pain  Brittney Clark, 74 year old retired female, returns reporting lumbar and right lower extremity pain and numbness and tingling since May.  She recalls no injury.  ESI 1 in August offered no pain relief.  History: Autoimmune hepatitis (prednisone 5 mg daily) ; HTN, hiatal hernia Surgical history: 2002 neck, lumpectomy left 1980, right shoulder 2014  MRI on [facility]  The patient's hepatitis is stable but she is on 5 mg of prednisone daily along with Imuran.  The patient is been doing well with regard to her neck but is started having some neck pain recently but this is not terribly severe.  She is having a lot of low back pain and is complaining of pain radiating into her right leg.  She describes her pain as 7-20 out of 10.  She had an injection with significant relief.  She says her pain is worse with sitting and goes into her right buttock into her toes on the right leg.  She denies any left leg discomfort.  An MRI of the lumbar spine demonstrates significant facet arthropathy at L5-S1.  There is anterolisthesis of L5 and S1 on plain radiographs of 10 mm on the lateral radiograph 9.5 mm on extension and 7.8 mm on flexion.  She has some mild facet arthropathy at the L3 L4 and L4 L5 levels with these do not appear terribly severe.  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  The patient says she cannot tolerate the severity of her pain but is at the same time concerned about the idea of surgery.  I have suggested that she see her gastroenterologist to make sure it is safe to proceed with surgery given her history of hepatitis.        PAST MEDICAL/SURGICAL  HISTORY   (Detailed)  Disease/disorder Onset Date Management Date Comments    Surgery, cervical spine 2002     Breast surgery 1980     Shoulder surgery 2014   Arthritis      Hypertension      Liver disease         PAST MEDICAL HISTORY, SURGICAL HISTORY, FAMILY HISTORY, SOCIAL HISTORY AND REVIEW OF SYSTEMS I have reviewed the patient's past medical, surgical, family and social history as well as the comprehensive review of systems as included on the [facility] history form dated 09/20/2015, which I have signed.  Family History  (Detailed) Patient reports there is no relevant family history.    SOCIAL HISTORY  (Detailed) Tobacco use reviewed. Preferred language is Unknown.   Smoking status: Never smoker.  SMOKING STATUS Use Status Type Smoking Status Usage Per Day Years Used Total Pack Years  no/never  Never smoker       HOME ENVIRONMENT/SAFETY The patient has not fallen in the last year.        MEDICATIONS(added, continued or stopped this visit): Started Medication Directions Instruction Stopped   alendronate 70 mg tablet take 1 tablet by oral route  every week     Allegra Allergy 180 mg tablet Take before allergy shot and as needed     aspirin 81 mg tablet,delayed release take 1  tablet by oral route  every day     azathioprine 50 mg tablet take 1 tablet by oral route  every day     Calcium 600 + D(3) Take twice daily     carvedilol 25 mg tablet take 1 tablet by oral route 2 times every day with food     Culturelle Probiotics Take once daily in the morning     diltiazem CD 240 mg capsule,extended release 24 hr take 1 capsule by oral route  every day     isosorbide mononitrate ER 60 mg tablet,extended release 24 hr take 1 tablet by oral route  every day in the evening     losartan 50 mg tablet take 1 tablet by oral route  every day     omeprazole 40 mg capsule,delayed release take 1 capsule by oral route  every day in the morning      prednisone 5 mg tablet take 1 tablet by oral route  every day     Vitamin B-12 500 mcg tablet Take once daily       ALLERGIES: Ingredient Reaction Medication Name Comment  PENICILLINS Rash     Reviewed, updated.   REVIEW OF SYSTEMS System Neg/Pos Details  Constitutional Negative Chills, fatigue, fever, malaise, night sweats, weight gain and weight loss.  ENMT Negative Ear drainage, hearing loss, nasal drainage, otalgia, sinus pressure and sore throat.  Eyes Negative Eye discharge, eye pain and vision changes.  Respiratory Negative Chronic cough, cough, dyspnea, known TB exposure and wheezing.  Cardio Negative Chest pain, claudication, edema and irregular heartbeat/palpitations.  GI Negative Abdominal pain, blood in stool, change in stool pattern, constipation, decreased appetite, diarrhea, heartburn, nausea and vomiting.  GU Negative Dysuria, hematuria, hot flashes, irregular menses, polyuria, urinary frequency, urinary incontinence and urinary retention.  Endocrine Negative Cold intolerance, heat intolerance, polydipsia and polyphagia.  Neuro Positive Extremity weakness, Numbness in extremity.  Psych Negative Anxiety, depression and insomnia.  Integumentary Negative Brittle hair, brittle nails, change in shape/size of mole(s), hair loss, hirsutism, hives, pruritus, rash and skin lesion.  MS Positive Back pain.  Hema/Lymph Negative Easy bleeding, easy bruising and lymphadenopathy.  Allergic/Immuno Negative Contact allergy, environmental allergies, food allergies and seasonal allergies.  Reproductive Negative Breast discharge, breast lump(s), dysmenorrhea, dyspareunia, history of abnormal PAP smear and vaginal discharge.     Vitals Date Temp F BP Pulse Ht In Wt Lb BMI BSA Pain Score  09/20/2015  138/81 60 65.5 157 25.73  7/10     PHYSICAL EXAM General Level of Distress: no acute distress Overall Appearance: normal  Head and Face  Right Left  Fundoscopic Exam:   normal normal    Cardiovascular Cardiac: regular rate and rhythm without murmur  Right Left  Carotid Pulses: normal normal  Respiratory Lungs: clear to auscultation  Neurological Orientation: normal Recent and Remote Memory: normal Attention Span and Concentration:   normal Language: normal Fund of Knowledge: normal  Right Left Sensation: normal normal Upper Extremity Coordination: normal normal  Lower Extremity Coordination: normal normal  Musculoskeletal Gait and Station: normal  Right Left Upper Extremity Muscle Strength: normal normal Lower Extremity Muscle Strength: normal normal Upper Extremity Muscle Tone:  normal normal Lower Extremity Muscle Tone: normal normal  Motor Strength Upper and lower extremity motor strength was tested in the clinically pertinent muscles. Any abnormal findings will be noted below.   Right Left Medial Gastroc: 4/5    Deep Tendon Reflexes  Right Left Biceps: normal normal Triceps: normal normal Brachiloradialis: normal  normal Patellar: normal normal Achilles: normal normal  Sensory Sensation was tested at L1 to S1.   Cranial Nerves II. Optic Nerve/Visual Fields: normal III. Oculomotor: normal IV. Trochlear: normal V. Trigeminal: normal VI. Abducens: normal VII. Facial: normal VIII. Acoustic/Vestibular: normal IX. Glossopharyngeal: normal X. Vagus: normal XI. Spinal Accessory: normal XII. Hypoglossal: normal  Motor and other Tests Lhermittes: negative Rhomberg: negative Pronator drift: absent     Right Left Hoffman's: normal normal Clonus: normal normal Babinski: normal normal SLR: positive at 30 degrees negative Patrick's Corky Sox): negative negative Toe Walk: normal normal Toe Lift: normal normal Heel Walk: normal normal SI Joint: nontender nontender   Additional Findings:  Decreased in sensation in S1 distribution on the right.  Neck is good range of motion without radicular symptoms.patient is not  able to stand on her toes on the right.  She is able to do so without difficulty on the left.    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  Completed Orders (this encounter) Order Details Reason Side Interpretation Result Initial Treatment Date Region  Lumbar Spine- AP/Lat/Flex/Ex      09/20/2015 All Levels to All Levels  Cervical Spine- AP/Lat/Flex/Ex      09/20/2015 All Levels to All Levels  Lifestyle education regarding diet Encouraged to eat a well balanced diet and follow up with primary care physician.         Assessment/Plan # Detail Type Description   1. Assessment Cervicalgia (M54.2).       2. Assessment Spondylolisthesis, lumbosacral region (M43.17).       3. 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       4. Assessment Radiculopathy, lumbosacral region (M54.17).       5. Assessment Body mass index (BMI) 25.0-25.9, adult (X90.24).   Plan Orders Today's instructions / counseling include(s) Lifestyle education regarding diet.         Pain Assessment/Treatment Pain Scale: 7/10. Method: Numeric Pain Intensity Scale. Location: right leg. Onset: 04/20/2015. Duration: varies. Quality: discomforting. Pain Assessment/Treatment follow-up plan of care: Patient is taking medications as prescribed..  Fall Risk Plan The patient has not fallen in the last year.  I have recommended decompression and fusion at the L5-S1 level.  She will need clearance by her gastroenterologist prior to surgery given her history of autoimmune hepatitis.  She wants to consider her options and will let me know if she wishes to proceed.  I met with the patient and discussed the situation at great length with her and her 2 daughters.  We also went over her imaging and models and patient teaching at great length.the patient was fitted for LSO brace.  Because of her usage of long-term steroids she may not receive  interbody grafts if it appears that her bone quality is poor.  This would be a decision made at the time of surgery.  Orders: Diagnostic Procedures: Assessment Procedure  M43.17 Lumbar Spine- AP/Lat/Flex/Ex  M54.2 Cervical Spine- AP/Lat/Flex/Ex  M71.38 PLIF - L5-S1 MAS with resection of synovial cyst  Instruction(s)/Education: Assessment Instruction  Z68.25 Lifestyle education regarding diet             Provider:  Marchia Meiers. Vertell Limber MD  09/22/2015 07:46 AM Dictation edited by: Marchia Meiers. Vertell Limber    CC Providers: Erline Levine MD [address] [place], [place] [address]              Electronically signed by Marchia Meiers. Vertell Limber  MD on 09/22/2015 07:47 AM

## 2015-10-27 NOTE — Anesthesia Procedure Notes (Signed)
Procedure Name: Intubation Date/Time: 10/27/2015 8:07 AM Performed by: Rogers Blocker Pre-anesthesia Checklist: Patient identified, Timeout performed, Emergency Drugs available, Suction available and Patient being monitored Patient Re-evaluated:Patient Re-evaluated prior to inductionOxygen Delivery Method: Circle system utilized Preoxygenation: Pre-oxygenation with 100% oxygen Intubation Type: IV induction Ventilation: Mask ventilation without difficulty Laryngoscope Size: Mac and 3 Grade View: Grade I Tube type: Oral Tube size: 7.5 mm Number of attempts: 1 Airway Equipment and Method: Stylet Placement Confirmation: ETT inserted through vocal cords under direct vision,  breath sounds checked- equal and bilateral,  positive ETCO2 and CO2 detector Secured at: 22 cm Tube secured with: Tape Dental Injury: Teeth and Oropharynx as per pre-operative assessment

## 2015-10-27 NOTE — Progress Notes (Signed)
Utilization review completed.  

## 2015-10-27 NOTE — Anesthesia Postprocedure Evaluation (Signed)
Anesthesia Post Note  Patient: Brittney Clark  Procedure(s) Performed: Procedure(s) (LRB): FOR MAXIMUM ACCESS (MAS) POSTERIOR LUMBAR INTERBODY FUSION (PLIF) L5-S1 MAS with resection of synovial cyst (N/A)  Anesthesia type: General  Patient location: PACU  Post pain: Pain level controlled  Post assessment: Post-op Vital signs reviewed  Last Vitals: BP 158/95 mmHg  Pulse 57  Temp(Src) 36.6 C (Oral)  Resp 20  SpO2 98%  Post vital signs: Reviewed  Level of consciousness: sedated  Complications: No apparent anesthesia complications

## 2015-10-27 NOTE — Progress Notes (Signed)
Awake, alert, conversant.  MAEW with full power bilateral DF, EHL, PF.  Doing well.

## 2015-10-27 NOTE — Brief Op Note (Signed)
10/27/2015  10:27 AM  PATIENT:  Brittney Clark  74 y.o. female  PRE-OPERATIVE DIAGNOSIS:  Spondylolisthesis, Synovial cyst, Stenosis, radiculopathy L 5 S 1  POST-OPERATIVE DIAGNOSIS:   Spondylolisthesis, Synovial cyst, Stenosis, radiculopathy L 5 S 1  PROCEDURE:  Procedure(s): FOR MAXIMUM ACCESS (MAS) POSTERIOR  L5-S1 MAS with resection of synovial cyst (N/A), pedicle screw fixation and posterolateral arthrodesis  SURGEON:  Surgeon(s) and Role:    * Erline Levine, MD - Primary  PHYSICIAN ASSISTANT:   ASSISTANTS: [place], RN   ANESTHESIA:   general  EBL:  Total I/O In: 1000 [I.V.:1000] Out: 110 [Urine:60; Blood:50]  BLOOD ADMINISTERED:none  DRAINS: none   LOCAL MEDICATIONS USED:  MARCAINE    and LIDOCAINE   SPECIMEN:  No Specimen  DISPOSITION OF SPECIMEN:  N/A  COUNTS:  YES  TOURNIQUET:  * No tourniquets in log *  DICTATION: Patient is a 74 year old with spondylosis , stenosis, spondylolisthesis, synovial cyst and severe back and right lower extremity pain at L 5 S 1 levels of the lumbar spine. It was elected to take her to surgery for MASPLIF L 5 S 1 level with posterolateral arthrodesis.  Because of autoimmune hepatitis and long-term steroids, I informed patient that we may not proceed with interbody prostheses if I determined intra-operatively that she had poor bone quality.  Procedure:   Following uncomplicated induction of GETA, and placement of electrodes for neural monitoring, patient was turned into a prone position on the [place] tableand using AP  fluoroscopy the area of planned incision was marked, prepped with betadine scrub and Duraprep, then draped. Exposure was performed of facet joint complex at L 5 S 1 level and the MAS retractor was placed.5.5 x 30 mm cortical Nuvasive screws were placed at L 5 bilaterally according to standard landmarks using neural monitoring.  A total laminectomy of L 5 was then performed with disarticulation of facets.  This bone was  saved for grafting, combined with allograft chips after being run through bone mill and was placed in bone packing device.  The bone was felt to bee of poor quality and consequently, I elected not to perform discectomy and interbody grafting.   The synovial cyst was identified and carefully reomved from the neural elements, including the right L 5 and S 1 nerve roots, which were significantly compressed.  This was done under Loupe magnification and there was no violation of the dura.  Remaining screws were placed at S 1 (5.5 x 30 R and 5.5 x 35 L) and 35 mm rod was placed Right and 30 mm rod left.   And the screws were locked and torqued.Final Xrays showed well positioned screw fixation. The posterolateral region was packed with small BMP, Vitoss soaked in marrow rich blood aspirated from the pedicles and 20 cc of autograft on both sides of midline. The wounds were irrigated and then closed with 1, 2-0 and 3-0 Vicryl stitches. Long-acting Marcaine was infiltrated into the deep musculature.  Sterile occlusive dressing was placed with Dermabond and an occlusive dressing. The patient was then extubated in the operating room and taken to recovery in stable and satisfactory condition having tolerated her operation well. Counts were correct at the end of the case.  PLAN OF CARE: Admit to inpatient   PATIENT DISPOSITION:  PACU - hemodynamically stable.   Delay start of Pharmacological VTE agent (>24hrs) due to surgical blood loss or risk of bleeding: yes

## 2015-10-28 MED ORDER — METHOCARBAMOL 500 MG PO TABS: 500.0000 mg | ORAL_TABLET | Freq: Four times a day (QID) | ORAL | Status: DC | PRN

## 2015-10-28 MED ORDER — HYDROCODONE-ACETAMINOPHEN 7.5-325 MG PO TABS: 1.0000 | ORAL_TABLET | ORAL | Status: DC | PRN

## 2015-10-28 NOTE — Progress Notes (Signed)
Physical Therapy Evaluation & Discharge Patient Details Name: Brittney Clark MRN: [id number] DOB: 1941/02/24 Today's Date: 10/28/2015   History of Present Illness  s/p decompression and fusion L 5-S1  Clinical Impression  74 yo female presents with post surgical pain, no radicular pain any longer, and Supervision to Independent for all mobility including stairs.  Patient educated to be conservative with activity initially, maintain use of lumbar brace, use good technique for bed mobility, and follow up with MD on release interval for spinal precautions.  Warned that pain may be elevated on post op day 2-3, is expected if not too severe.  Patient cleared from PT services, ok to return home with daughters to assist.     Follow Up Recommendations No PT follow up    Equipment Recommendations  None recommended by PT    Recommendations for Other Services       Precautions / Restrictions Precautions Precautions: Back Precaution Booklet Issued: Yes (comment) Precaution Comments: reviewed back precautions related to mobility and ADL Required Braces or Orthoses: Spinal Brace Spinal Brace: Lumbar corset;Applied in sitting position;Applied in standing position Restrictions Weight Bearing Restrictions: No      Mobility  Bed Mobility Overal bed mobility: Needs Assistance Bed Mobility: Rolling;Sidelying to Sit;Sit to Sidelying Rolling: Supervision Sidelying to sit: Supervision     Sit to sidelying: Supervision General bed mobility comments: verbal cues for log roll technique, practiced x 2  Transfers Overall transfer level: Modified independent Equipment used: None                Ambulation/Gait Ambulation/Gait assistance: Independent Ambulation Distance (Feet): 150 Feet Assistive device: None Gait Pattern/deviations: Step-through pattern   Gait velocity interpretation: at or above normal speed for age/gender    Stairs Stairs: Yes Stairs assistance: Modified  independent (Device/Increase time) Stair Management: One rail Right Number of Stairs: 8    Wheelchair Mobility    Modified Rankin (Stroke Patients Only)       Balance Overall balance assessment: Independent                                           Pertinent Vitals/Pain Pain Assessment: 0-10 Pain Score: 6  Pain Location: Back Pain Descriptors / Indicators: Grimacing;Operative site guarding Pain Intervention(s): Monitored during session;Premedicated before session    Home Living Family/patient expects to be discharged to:: Private residence Living Arrangements: Spouse/significant other Available Help at Discharge: Family;Available 24 hours/day Type of Home: House Home Access: Level entry     Home Layout: One level Home Equipment: Bedside commode;Adaptive equipment;Shower seat - built in      Prior Function Level of Independence: Independent         Comments: husband manages most of housekeeping      Hand Dominance   Dominant Hand: Right    Extremity/Trunk Assessment   Upper Extremity Assessment: Defer to OT evaluation;Overall WFL for tasks assessed           Lower Extremity Assessment: Overall WFL for tasks assessed         Communication   Communication: No difficulties  Cognition Arousal/Alertness: Awake/alert Behavior During Therapy: WFL for tasks assessed/performed Overall Cognitive Status: Within Functional Limits for tasks assessed                      General Comments      Exercises  Assessment/Plan    PT Assessment Patent does not need any further PT services  PT Diagnosis Acute pain   PT Problem List    PT Treatment Interventions     PT Goals (Current goals can be found in the Care Plan section) Acute Rehab PT Goals Patient Stated Goal: return home PT Goal Formulation: All assessment and education complete, DC therapy    Frequency     Barriers to discharge        Co-evaluation                End of Session Equipment Utilized During Treatment: Gait belt;Back brace Activity Tolerance: Patient tolerated treatment well;No increased pain Patient left: in chair;with family/visitor present;with call bell/phone within reach Nurse Communication: Mobility status         Time: [id number] PT Time Calculation (min) (ACUTE ONLY): 10 min   Charges:   PT Evaluation $Initial PT Evaluation Tier I: 1 Procedure     PT G CodesZenia Clark, Brittney Clark L November 22, 2015, 9:48 AM

## 2015-10-28 NOTE — Discharge Instructions (Signed)

## 2015-10-28 NOTE — Evaluation (Signed)
Occupational Therapy Evaluation and Discharge Patient Details Name: Brittney Clark MRN: [id number] DOB: [birth year] Today's Date: 10/28/2015    History of Present Illness s/p decompression and fusion L 5-S1   Clinical Impression   Pt was independent and active prior to admission.  Performing at a supervision to modified independent level in mobility and ADL.  Will have assist for IADL.  Educated pt and daughter at length in back precautions related to ADL.  No further OT needs. Pt eager to go home.    Follow Up Recommendations  No OT follow up    Equipment Recommendations  None recommended by OT    Recommendations for Other Services       Precautions / Restrictions Precautions Precautions: Back Precaution Booklet Issued: Yes (comment) Precaution Comments: reviewed back precautions related to mobility and ADL Required Braces or Orthoses: Spinal Brace Spinal Brace: Lumbar corset;Applied in sitting position;Applied in standing position Restrictions Weight Bearing Restrictions: No      Mobility Bed Mobility Overal bed mobility: Needs Assistance Bed Mobility: Rolling;Sidelying to Sit;Sit to Sidelying Rolling: Supervision Sidelying to sit: Supervision     Sit to sidelying: Supervision General bed mobility comments: verbal cues for log roll technique, practiced x 2  Transfers Overall transfer level: Modified independent Equipment used: None                  Balance                                            ADL Overall ADL's : Modified independent                                       General ADL Comments: Pt able to cross foot over opposite knee to reach feet. Recommended use of long bath sponge for back and feet as pt plans to stand to shower. Instructed in safe footwear.  Pt able to don her back brace independently.     Vision     Perception     Praxis      Pertinent Vitals/Pain Pain Assessment: 0-10 Pain  Score: 6  Pain Location: back Pain Descriptors / Indicators: Grimacing;Operative site guarding;Sore Pain Intervention(s): Premedicated before session;Monitored during session     Hand Dominance Right   Extremity/Trunk Assessment Upper Extremity Assessment Upper Extremity Assessment: Overall WFL for tasks assessed   Lower Extremity Assessment Lower Extremity Assessment: Overall WFL for tasks assessed       Communication Communication Communication: No difficulties   Cognition Arousal/Alertness: Awake/alert Behavior During Therapy: WFL for tasks assessed/performed Overall Cognitive Status: Within Functional Limits for tasks assessed                     General Comments       Exercises       Shoulder Instructions      Home Living Family/patient expects to be discharged to:: Private residence Living Arrangements: Spouse/significant other Available Help at Discharge: Family;Available 24 hours/day Type of Home: House Home Access: Level entry     Home Layout: One level     Bathroom Shower/Tub: Occupational psychologist: Handicapped height     Home Equipment: Bedside commode;Adaptive equipment;Shower seat - built in [employer] Equipment: Reacher;Long-handled sponge  Prior Functioning/Environment Level of Independence: Independent        Comments: husband manages most of housekeeping     OT Diagnosis: Generalized weakness;Acute pain   OT Problem List:     OT Treatment/Interventions:      OT Goals(Current goals can be found in the care plan section) Acute Rehab OT Goals Patient Stated Goal: return home  OT Frequency:     Barriers to D/C:            Co-evaluation              End of Session Equipment Utilized During Treatment: Back brace  Activity Tolerance: Patient tolerated treatment well Patient left: Other (comment) (with PT)   Time: [id number] OT Time Calculation (min): 18 min Charges:  OT General Charges $OT  Visit: 1 Procedure OT Evaluation $Initial OT Evaluation Tier I: 1 Procedure G-Codes:    Malka So 10/28/2015, 9:33 AM  [phone]

## 2015-10-28 NOTE — Progress Notes (Signed)
Patient alert and oriented, mae's well, voiding adequate amount of urine, swallowing without difficulty, no c/o pain. Patient discharged home with family. Script and discharged instructions given to patient. Patient and family stated understanding of d/c instructions given and has an appointment with MD. 

## 2015-10-28 NOTE — Discharge Summary (Signed)
Physician Discharge Summary  Patient ID: Brittney Clark MRN: [id number] DOB/AGE: 72-19-1942 72 y.o.  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  Consults: None  Significant Diagnostic Studies: None  Treatments: surgery: decompression and fusion L 5 S 1 level  Discharge Exam: Blood pressure 165/80, pulse 60, temperature 98.2 F (36.8 C), temperature source Oral, resp. rate 16, SpO2 98 %. Neurologic: Alert and oriented X 3, normal strength and tone. Normal symmetric reflexes. Normal coordination and gait Wound:CDI  Disposition: Home     Medication List    TAKE these medications        alendronate 70 MG tablet  Commonly known as:  FOSAMAX  Take 70 mg by mouth once a week. On Monday     aspirin EC 81 MG tablet  Take 81 mg by mouth daily.     azaTHIOprine 50 MG tablet  Commonly known as:  IMURAN  Take 50 mg by mouth daily.     calcium carbonate 1250 (500 CA) MG tablet  Commonly known as:  OS-CAL - dosed in mg of elemental calcium  Take 1 tablet by mouth daily.     carvedilol 25 MG tablet  Commonly known as:  COREG  Take 25 mg by mouth 2 (two) times daily with a meal.     diltiazem 240 MG 24 hr capsule  Commonly known as:  DILACOR XR  Take 240 mg by mouth daily.     docusate sodium 100 MG capsule  Commonly known as:  COLACE  Take 100 mg by mouth 2 (two) times daily.     EPINEPHrine 0.3 mg/0.3 mL Soaj injection  Commonly known as:  EPI-PEN  Inject 0.3 mg into the muscle once as needed (for allergic reaction).     fexofenadine 180 MG tablet   Commonly known as:  ALLEGRA  Take 180 mg by mouth daily as needed (before allergy injection).     HYDROcodone-acetaminophen 7.5-325 MG tablet  Commonly known as:  NORCO  Take 1-2 tablets by mouth every 4 (four) hours as needed for moderate pain.     isosorbide mononitrate 120 MG 24 hr tablet  Commonly known as:  IMDUR  Take 120 mg by mouth daily.     LACTOBACILLUS PO  Take 1 capsule by mouth daily.     losartan 50 MG tablet  Commonly known as:  COZAAR  Take 50 mg by mouth daily.     methocarbamol 500 MG tablet  Commonly known as:  ROBAXIN  Take 1 tablet (500 mg total) by mouth every 6 (six) hours as needed for muscle spasms.     predniSONE 5 MG tablet  Commonly known as:  DELTASONE  Take 5 mg by mouth daily.     PRESCRIPTION MEDICATION  Inject 1 Dose as directed once a week. Patient uses allergy shots on Tuesday     vitamin B-12 500 MCG tablet  Commonly known as:  CYANOCOBALAMIN  Take 500 mcg by mouth daily.         Signed: Peggyann Shoals, MD 10/28/2015, 6:39 AM

## 2015-10-28 NOTE — Progress Notes (Signed)
Subjective: Patient reports doing great  Objective: Vital signs in last 24 hours: Temp:  [97 F (36.1 C)-98.2 F (36.8 C)] 98.2 F (36.8 C) (11/19 0430) Pulse Rate:  [57-73] 60 (11/19 0430) Resp:  [14-33] 16 (11/19 0430) BP: (103-165)/(67-95) 165/80 mmHg (11/19 0430) SpO2:  [93 %-99 %] 98 % (11/19 0430)  Intake/Output from previous day: 11/18 0701 - 11/19 0700 In: 1940 [P.O.:840; I.V.:1100] Out: 310 [Urine:260; Blood:50] Intake/Output this shift:    Physical Exam: Full strength.  Dressing CDI.  Lab Results: No results for input(s): WBC, HGB, HCT, PLT in the last 72 hours. BMET No results for input(s): NA, K, CL, CO2, GLUCOSE, BUN, CREATININE, CALCIUM in the last 72 hours.  Studies/Results: Dg Lumbar Spine 2-3 Views  10/27/2015  CLINICAL DATA:  74 year old female undergoing posterior L5-S1 fusion. Initial encounter. EXAM: DG C-ARM 61-120 MIN; LUMBAR SPINE - 2-3 VIEW COMPARISON:  [facility] lumbar radiographs 09/20/2015. [facility] lumbar MRI 07/10/2015. FINDINGS: Normal lumbar segmentation demonstrated on the comparison radiographs which is the same numbering system used on the prior MRI, demonstrating grade 1 L5-S1 anterolisthesis. Two intraoperative fluoroscopic images demonstrate transpedicular screw placement at L5 and S1. Posterior connecting rods not yet in place. FLUOROSCOPY TIME:  0 minutes 32 seconds IMPRESSION: L5-S1 transpedicular hardware placed. Electronically Signed   By: Genevie Ann M.D.   On: 10/27/2015 10:28   Dg C-arm 1-60 Min  10/27/2015  CLINICAL DATA:  74 year old female undergoing posterior L5-S1 fusion. Initial encounter. EXAM: DG C-ARM 61-120 MIN; LUMBAR SPINE - 2-3 VIEW COMPARISON:  [facility] lumbar radiographs 09/20/2015. [facility] lumbar MRI 07/10/2015. FINDINGS: Normal lumbar segmentation demonstrated on the comparison radiographs which is the same numbering system used on the prior MRI,  demonstrating grade 1 L5-S1 anterolisthesis. Two intraoperative fluoroscopic images demonstrate transpedicular screw placement at L5 and S1. Posterior connecting rods not yet in place. FLUOROSCOPY TIME:  0 minutes 32 seconds IMPRESSION: L5-S1 transpedicular hardware placed. Electronically Signed   By: Genevie Ann M.D.   On: 10/27/2015 10:28    Assessment/Plan: Discharge home.  Doing well.     LOS: 1 day    Peggyann Shoals, MD 10/28/2015, 6:38 AM

## 2015-10-30 ENCOUNTER — Encounter (HOSPITAL_COMMUNITY): Payer: Self-pay | Admitting: Neurosurgery

## 2016-02-05 ENCOUNTER — Other Ambulatory Visit: Payer: Self-pay | Admitting: Internal Medicine

## 2016-02-05 DIAGNOSIS — Z1231 Encounter for screening mammogram for malignant neoplasm of breast: Secondary | ICD-10-CM

## 2016-02-13 ENCOUNTER — Ambulatory Visit
Admission: RE | Admit: 2016-02-13 | Discharge: 2016-02-13 | Disposition: A | Discharge status: Home / Self Care | Payer: Medicare Other | Source: Ambulatory Visit | Attending: Internal Medicine | Admitting: Internal Medicine

## 2016-02-13 DIAGNOSIS — Z1231 Encounter for screening mammogram for malignant neoplasm of breast: Secondary | ICD-10-CM | POA: Diagnosis present

## 2016-02-14 ENCOUNTER — Ambulatory Visit: Payer: Medicare Other

## 2016-08-26 ENCOUNTER — Other Ambulatory Visit: Payer: Self-pay | Admitting: Gastroenterology

## 2016-08-26 DIAGNOSIS — K754 Autoimmune hepatitis: Secondary | ICD-10-CM

## 2016-09-03 ENCOUNTER — Ambulatory Visit
Admission: RE | Admit: 2016-09-03 | Discharge: 2016-09-03 | Disposition: A | Discharge status: Home / Self Care | Payer: Medicare Other | Source: Ambulatory Visit | Attending: Gastroenterology | Admitting: Gastroenterology

## 2016-09-03 DIAGNOSIS — N281 Cyst of kidney, acquired: Secondary | ICD-10-CM | POA: Diagnosis not present

## 2016-09-03 DIAGNOSIS — K754 Autoimmune hepatitis: Secondary | ICD-10-CM | POA: Insufficient documentation

## 2017-02-10 ENCOUNTER — Other Ambulatory Visit: Payer: Self-pay | Admitting: Internal Medicine

## 2017-02-10 DIAGNOSIS — Z1231 Encounter for screening mammogram for malignant neoplasm of breast: Secondary | ICD-10-CM

## 2017-02-18 ENCOUNTER — Other Ambulatory Visit: Payer: Self-pay | Admitting: Internal Medicine

## 2017-02-18 ENCOUNTER — Ambulatory Visit
Admission: RE | Admit: 2017-02-18 | Discharge: 2017-02-18 | Disposition: A | Discharge status: Home / Self Care | Payer: Medicare Other | Source: Ambulatory Visit | Attending: Internal Medicine | Admitting: Internal Medicine

## 2017-02-18 DIAGNOSIS — Z1231 Encounter for screening mammogram for malignant neoplasm of breast: Secondary | ICD-10-CM | POA: Diagnosis present

## 2017-02-24 ENCOUNTER — Other Ambulatory Visit: Payer: Self-pay | Admitting: Gastroenterology

## 2017-02-24 DIAGNOSIS — K754 Autoimmune hepatitis: Secondary | ICD-10-CM

## 2017-03-03 ENCOUNTER — Ambulatory Visit: Payer: Medicare Other

## 2017-03-04 ENCOUNTER — Ambulatory Visit: Payer: Medicare Other

## 2017-03-04 ENCOUNTER — Ambulatory Visit
Admission: RE | Admit: 2017-03-04 | Discharge: 2017-03-04 | Disposition: A | Discharge status: Home / Self Care | Payer: Medicare Other | Source: Ambulatory Visit | Attending: Gastroenterology | Admitting: Gastroenterology

## 2017-03-04 DIAGNOSIS — I77811 Abdominal aortic ectasia: Secondary | ICD-10-CM | POA: Diagnosis not present

## 2017-03-04 DIAGNOSIS — N281 Cyst of kidney, acquired: Secondary | ICD-10-CM | POA: Insufficient documentation

## 2017-03-04 DIAGNOSIS — K754 Autoimmune hepatitis: Secondary | ICD-10-CM

## 2017-08-27 ENCOUNTER — Other Ambulatory Visit: Payer: Self-pay | Admitting: Gastroenterology

## 2017-08-27 DIAGNOSIS — K754 Autoimmune hepatitis: Secondary | ICD-10-CM

## 2017-09-02 ENCOUNTER — Ambulatory Visit
Admission: RE | Admit: 2017-09-02 | Discharge: 2017-09-02 | Disposition: A | Discharge status: Home / Self Care | Payer: Medicare Other | Source: Ambulatory Visit | Attending: Gastroenterology | Admitting: Gastroenterology

## 2017-09-02 DIAGNOSIS — K754 Autoimmune hepatitis: Secondary | ICD-10-CM | POA: Diagnosis present

## 2018-02-16 ENCOUNTER — Other Ambulatory Visit: Payer: Self-pay | Admitting: Internal Medicine

## 2018-02-16 DIAGNOSIS — Z1231 Encounter for screening mammogram for malignant neoplasm of breast: Secondary | ICD-10-CM

## 2018-02-24 ENCOUNTER — Ambulatory Visit
Admission: RE | Admit: 2018-02-24 | Discharge: 2018-02-24 | Disposition: A | Discharge status: Home / Self Care | Payer: Medicare HMO | Source: Ambulatory Visit | Attending: Internal Medicine | Admitting: Internal Medicine

## 2018-02-24 DIAGNOSIS — Z1231 Encounter for screening mammogram for malignant neoplasm of breast: Secondary | ICD-10-CM | POA: Diagnosis present

## 2018-02-25 ENCOUNTER — Other Ambulatory Visit: Payer: Self-pay | Admitting: Gastroenterology

## 2018-02-25 DIAGNOSIS — K754 Autoimmune hepatitis: Secondary | ICD-10-CM

## 2018-03-02 ENCOUNTER — Ambulatory Visit: Payer: Medicare HMO

## 2018-03-03 ENCOUNTER — Ambulatory Visit
Admission: RE | Admit: 2018-03-03 | Discharge: 2018-03-03 | Disposition: A | Discharge status: Home / Self Care | Payer: Medicare HMO | Source: Ambulatory Visit | Attending: Gastroenterology | Admitting: Gastroenterology

## 2018-03-03 DIAGNOSIS — K754 Autoimmune hepatitis: Secondary | ICD-10-CM | POA: Insufficient documentation

## 2018-03-03 DIAGNOSIS — N281 Cyst of kidney, acquired: Secondary | ICD-10-CM | POA: Insufficient documentation

## 2018-07-04 ENCOUNTER — Ambulatory Visit
Admission: EM | Admit: 2018-07-04 | Discharge: 2018-07-04 | Disposition: A | Discharge status: Home / Self Care | Payer: Medicare HMO | Attending: Family Medicine | Admitting: Family Medicine

## 2018-07-04 ENCOUNTER — Encounter: Payer: Self-pay | Admitting: Gynecology

## 2018-07-04 ENCOUNTER — Other Ambulatory Visit: Payer: Self-pay

## 2018-07-04 DIAGNOSIS — R059 Cough, unspecified: Secondary | ICD-10-CM

## 2018-07-04 DIAGNOSIS — J40 Bronchitis, not specified as acute or chronic: Secondary | ICD-10-CM

## 2018-07-04 DIAGNOSIS — D849 Immunodeficiency, unspecified: Secondary | ICD-10-CM

## 2018-07-04 DIAGNOSIS — R05 Cough: Secondary | ICD-10-CM

## 2018-07-04 MED ORDER — SULFAMETHOXAZOLE-TRIMETHOPRIM 800-160 MG PO TABS: 1.0000 | ORAL_TABLET | Freq: Two times a day (BID) | ORAL | 0 refills | Status: DC

## 2018-07-04 MED ORDER — HYDROCOD POLST-CPM POLST ER 10-8 MG/5ML PO SUER: 5.0000 mL | Freq: Two times a day (BID) | ORAL | 0 refills | Status: DC | PRN

## 2018-07-04 NOTE — ED Triage Notes (Signed)
Per patient c/o cough / chest congestion. Per patient coughing bad at night. Pt. Stated history of bronchitis.

## 2018-08-31 ENCOUNTER — Other Ambulatory Visit: Payer: Self-pay | Admitting: Gastroenterology

## 2018-08-31 DIAGNOSIS — K754 Autoimmune hepatitis: Secondary | ICD-10-CM

## 2018-09-04 ENCOUNTER — Ambulatory Visit
Admission: RE | Admit: 2018-09-04 | Discharge: 2018-09-04 | Disposition: A | Discharge status: Home / Self Care | Payer: Medicare HMO | Source: Ambulatory Visit | Attending: Gastroenterology | Admitting: Gastroenterology

## 2018-09-04 DIAGNOSIS — N281 Cyst of kidney, acquired: Secondary | ICD-10-CM | POA: Diagnosis not present

## 2018-09-04 DIAGNOSIS — K754 Autoimmune hepatitis: Secondary | ICD-10-CM | POA: Diagnosis present

## 2019-03-22 DIAGNOSIS — D849 Immunodeficiency, unspecified: Secondary | ICD-10-CM | POA: Insufficient documentation

## 2019-05-14 DIAGNOSIS — R700 Elevated erythrocyte sedimentation rate: Secondary | ICD-10-CM | POA: Insufficient documentation

## 2019-06-01 ENCOUNTER — Telehealth: Payer: Self-pay | Admitting: Oncology

## 2019-06-02 ENCOUNTER — Encounter: Payer: Self-pay | Admitting: Oncology

## 2019-06-02 ENCOUNTER — Inpatient Hospital Stay: Payer: Medicare HMO | Attending: Oncology | Admitting: Oncology

## 2019-06-02 ENCOUNTER — Other Ambulatory Visit: Payer: Self-pay

## 2019-06-02 DIAGNOSIS — R778 Other specified abnormalities of plasma proteins: Secondary | ICD-10-CM | POA: Diagnosis not present

## 2019-06-02 DIAGNOSIS — Z79899 Other long term (current) drug therapy: Secondary | ICD-10-CM | POA: Insufficient documentation

## 2019-06-02 DIAGNOSIS — D649 Anemia, unspecified: Secondary | ICD-10-CM

## 2019-06-02 DIAGNOSIS — Z791 Long term (current) use of non-steroidal anti-inflammatories (NSAID): Secondary | ICD-10-CM | POA: Insufficient documentation

## 2019-06-02 DIAGNOSIS — Z7952 Long term (current) use of systemic steroids: Secondary | ICD-10-CM | POA: Insufficient documentation

## 2019-06-02 DIAGNOSIS — D709 Neutropenia, unspecified: Secondary | ICD-10-CM | POA: Diagnosis not present

## 2019-06-02 DIAGNOSIS — I10 Essential (primary) hypertension: Secondary | ICD-10-CM | POA: Insufficient documentation

## 2019-06-02 DIAGNOSIS — Z7982 Long term (current) use of aspirin: Secondary | ICD-10-CM | POA: Insufficient documentation

## 2019-06-02 DIAGNOSIS — K754 Autoimmune hepatitis: Secondary | ICD-10-CM | POA: Insufficient documentation

## 2019-06-02 NOTE — Progress Notes (Signed)
Pt states that she was told by dr Meda Coffee that she was being sent over because of labs that she drew were abnormal. Her biggest issue is the arthritis that she has.

## 2019-06-02 NOTE — Progress Notes (Signed)
I connected with Brittney Clark on 06/02/19 at 11:30 AM EDT by video enabled telemedicine visit and verified that I am speaking with the correct person using two identifiers.   I discussed the limitations, risks, security and privacy concerns of performing an evaluation and management service by telemedicine and the availability of in-person appointments. I also discussed with the patient that there may be a patient responsible charge related to this service. The patient expressed understanding and agreed to proceed.  Other persons participating in the visit and their role in the encounter:  none  Patient's location:  home Provider's location:  work  Reason for referral: Abnormal SPEP Referring provider: Dr. Jenny Reichmann  History of present illness: Patient is a 78 year old female with a history of autoimmune hepatitis diagnosed in 2005 who is on Imuran and sees [facility].  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  She is currently off prednisone.  She was also seen recently by rheumatology for symptoms of polyarthralgia.  During that time she had some blood work done which showed abnormal SPEP and hence the patient has been referred to [place]. Most recent CBC from 05/26/2019 showed white count of 2.6, H&H of 10.9/33.5 and a platelet count of 277.  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  She also has normocytic anemia and her baseline hemoglobin runs between 10-11.  Her white count has been a little lower than her baseline of 3-4 since she came off prednisone.  Patient mainly currently reports joint pain and some fatigue.  Denies other complaints.  Denies any recurrent infections or hospitalizations.  Her appetite and weight are stable     Review of Systems  Constitutional: Positive for malaise/fatigue. Negative for chills, fever and weight loss.  HENT: Negative for congestion, ear  discharge and nosebleeds.   Eyes: Negative for blurred vision.  Respiratory: Negative for cough, hemoptysis, sputum production, shortness of breath and wheezing.   Cardiovascular: Negative for chest pain, palpitations, orthopnea and claudication.  Gastrointestinal: Negative for abdominal pain, blood in stool, constipation, diarrhea, heartburn, melena, nausea and vomiting.  Genitourinary: Negative for dysuria, flank pain, frequency, hematuria and urgency.  Musculoskeletal: Positive for joint pain. Negative for back pain and myalgias.  Skin: Negative for rash.  Neurological: Negative for dizziness, tingling, focal weakness, seizures, weakness and headaches.  Endo/Heme/Allergies: Does not bruise/bleed easily.  Psychiatric/Behavioral: Negative for depression and suicidal ideas. The patient does not have insomnia.     Allergies  Allergen Reactions  . Amlodipine Other (See Comments)    Hair loss  . Amlodipine Besy-Benazepril Hcl Other (See Comments)    Hair loss  . Clonidine Other (See Comments)    Hair loss  . Diltiazem Other (See Comments)    Hair loss  . Hydralazine Other (See Comments)  . Penicillins Rash    Has patient had a PCN reaction causing immediate rash, facial/tongue/throat swelling, SOB or lightheadedness with hypotension: No Has patient had a PCN reaction causing severe rash involving mucus membranes or skin necrosis: No Has patient had a PCN reaction that required hospitalization No Has patient had a PCN reaction occurring within the last 10 years: No If all of the above answers are "NO", then may proceed with Cephalosporin use.     Past Medical History:  Diagnosis Date  . Anemia   . Arthritis   . GERD (gastroesophageal reflux disease)   . Hypertension   .  Immune deficiency disorder ([place])   . Leukopenia   . Venous stasis     Past Surgical History:  Procedure Laterality Date  . ABDOMINAL HYSTERECTOMY    . APPENDECTOMY    . BREAST BIOPSY Left 1980   neg  .  BREAST SURGERY    . MAXIMUM ACCESS (MAS)POSTERIOR LUMBAR INTERBODY FUSION (PLIF) 1 LEVEL N/A 10/27/2015   Procedure: FOR MAXIMUM ACCESS (MAS) POSTERIOR LUMBAR INTERBODY FUSION (PLIF) L5-S1 MAS with resection of synovial cyst;  Surgeon: Erline Levine, MD;  Location: [facility];  Service: Neurosurgery;  Laterality: N/A;  . NECK SURGERY    . ROTATOR CUFF REPAIR      Social History   Socioeconomic History  . Marital status: Married    Spouse name: Not on file  . Number of children: Not on file  . Years of education: Not on file  . Highest education level: Not on file  Occupational History  . Not on file  Social Needs  . Financial resource strain: Not on file  . Food insecurity    Worry: Not on file    Inability: Not on file  . Transportation needs    Medical: Not on file    Non-medical: Not on file  Tobacco Use  . Smoking status: Never Smoker  . Smokeless tobacco: Never Used  Substance and Sexual Activity  . Alcohol use: No  . Drug use: Not on file  . Sexual activity: Not on file  Lifestyle  . Physical activity    Days per week: Not on file    Minutes per session: Not on file  . Stress: Not on file  Relationships  . Social Herbalist on phone: Not on file    Gets together: Not on file    Attends religious service: Not on file    Active member of club or organization: Not on file    Attends meetings of clubs or organizations: Not on file    Relationship status: Not on file  . Intimate partner violence    Fear of current or ex partner: Not on file    Emotionally abused: Not on file    Physically abused: Not on file    Forced sexual activity: Not on file  Other Topics Concern  . Not on file  Social History Narrative  . Not on file    Family History  Problem Relation Age of Onset  . Breast cancer Paternal Aunt   . Diabetes Neg Hx      Current Outpatient Medications:  .  aspirin EC 81 MG tablet, Take 81 mg by mouth daily., Disp: , Rfl:  .   azaTHIOprine (IMURAN) 50 MG tablet, Take 50 mg by mouth daily., Disp: , Rfl:  .  calcium carbonate (OS-CAL - DOSED IN MG OF ELEMENTAL CALCIUM) 1250 (500 CA) MG tablet, Take 1 tablet by mouth daily., Disp: , Rfl:  .  carvedilol (COREG) 25 MG tablet, Take 25 mg by mouth 2 (two) times daily with a meal., Disp: , Rfl:  .  diltiazem (DILACOR XR) 240 MG 24 hr capsule, Take 240 mg by mouth daily., Disp: , Rfl:  .  fexofenadine (ALLEGRA) 180 MG tablet, Take 180 mg by mouth daily as needed (before allergy injection)., Disp: , Rfl:  .  isosorbide mononitrate (IMDUR) 120 MG 24 hr tablet, Take 120 mg by mouth daily., Disp: , Rfl:  .  meloxicam (MOBIC) 7.5 MG tablet, Take 7.5 mg by mouth daily., Disp: ,  Rfl:  .  vitamin B-12 (CYANOCOBALAMIN) 500 MCG tablet, Take 500 mcg by mouth daily., Disp: , Rfl:  .  docusate sodium (COLACE) 100 MG capsule, Take 100 mg by mouth 2 (two) times daily., Disp: , Rfl:  .  PRESCRIPTION MEDICATION, Inject 1 Dose as directed once a week. Patient uses allergy shots on Tuesday, Disp: , Rfl:      CMP Latest Ref Rng & Units 10/19/2015  Glucose 65 - 99 mg/dL 96  BUN 6 - 20 mg/dL 13  Creatinine 0.44 - 1.00 mg/dL 1.08(H)  Sodium 135 - 145 mmol/L 137  Potassium 3.5 - 5.1 mmol/L 3.2(L)  Chloride 101 - 111 mmol/L 100(L)  CO2 22 - 32 mmol/L 28  Calcium 8.9 - 10.3 mg/dL 9.7  Total Protein 6.5 - 8.1 g/dL -  Total Bilirubin 0.3 - 1.2 mg/dL -  Alkaline Phos 38 - 126 U/L -  AST 15 - 41 U/L -  ALT 14 - 54 U/L -   CBC Latest Ref Rng & Units 10/19/2015  WBC 4.0 - 10.5 K/uL 6.1  Hemoglobin 12.0 - 15.0 g/dL 12.0  Hematocrit 36.0 - 46.0 % 36.6  Platelets 150 - 400 K/uL 228     Observation/objective: Appears in no acute distress of a video visit today.  Breathing is nonlabored  Assessment and plan: Patient is a 78 year old female with history of autoimmune hepatitis currently on Imuran with following issues:  1.  Abnormal SPEP: This was incidentally found on blood work done on  05/26/2019 which showed an IgG lambda M spike of 0.3 g.  I will further work this up with a CBC with differential, myeloma panel and serum free light chains at this time.  2. Normocytic anemia: Probably secondary to chronic disease/autoimmune hepatitis.  I will check ferritin and iron studies, B12 and folate, reticulocyte count, TSH at this time  3.  Leukopenia/neutropenia.  Patient was chronically on steroids and her white count was closer to 3.4-4.  After she came off steroids her white count has slowly drifted down to 2.5 which again could be secondary to Imuran versus autoimmune neutropenia.  It is still mild at this time and does not require any treatment.  I will check a smear review at this time and continue to monitor  Follow-up instructions: Labs sometime in the next 1 or 2 days.  Video visit with MD in 2 weeks time  I discussed the assessment and treatment plan with the patient. The patient was provided an opportunity to ask questions and all were answered. The patient agreed with the plan and demonstrated an understanding of the instructions.   The patient was advised to call back or seek an in-person evaluation if the symptoms worsen or if the condition fails to improve as anticipated.   Visit Diagnosis: 1. Neutropenia, unspecified type ([place])   2. Normocytic anemia   3. Abnormal SPEP     Dr. Randa Evens, MD, MPH [facility] at [facility] [phone] 06/02/2019 10:58 AM

## 2019-06-03 ENCOUNTER — Inpatient Hospital Stay: Payer: Medicare HMO

## 2019-06-03 ENCOUNTER — Other Ambulatory Visit: Payer: Self-pay

## 2019-06-03 DIAGNOSIS — Z7952 Long term (current) use of systemic steroids: Secondary | ICD-10-CM | POA: Diagnosis not present

## 2019-06-03 DIAGNOSIS — R778 Other specified abnormalities of plasma proteins: Secondary | ICD-10-CM | POA: Diagnosis not present

## 2019-06-03 DIAGNOSIS — I10 Essential (primary) hypertension: Secondary | ICD-10-CM | POA: Diagnosis not present

## 2019-06-03 DIAGNOSIS — D709 Neutropenia, unspecified: Secondary | ICD-10-CM

## 2019-06-03 DIAGNOSIS — D649 Anemia, unspecified: Secondary | ICD-10-CM

## 2019-06-03 DIAGNOSIS — K754 Autoimmune hepatitis: Secondary | ICD-10-CM | POA: Diagnosis not present

## 2019-06-03 DIAGNOSIS — Z791 Long term (current) use of non-steroidal anti-inflammatories (NSAID): Secondary | ICD-10-CM | POA: Diagnosis not present

## 2019-06-03 DIAGNOSIS — Z79899 Other long term (current) drug therapy: Secondary | ICD-10-CM | POA: Diagnosis not present

## 2019-06-03 DIAGNOSIS — Z7982 Long term (current) use of aspirin: Secondary | ICD-10-CM | POA: Diagnosis not present

## 2019-06-03 LAB — IRON AND TIBC
Iron: 49 ug/dL (ref 28–170)
Saturation Ratios: 12 % (ref 10.4–31.8)
TIBC: 400 ug/dL (ref 250–450)
UIBC: 351 ug/dL

## 2019-06-03 LAB — CBC WITH DIFFERENTIAL/PLATELET
Abs Immature Granulocytes: 0 10*3/uL (ref 0.00–0.07)
Basophils Absolute: 0 10*3/uL (ref 0.0–0.1)
Basophils Relative: 1 %
Eosinophils Absolute: 0 10*3/uL (ref 0.0–0.5)
Eosinophils Relative: 0 %
HCT: 33 % — ABNORMAL LOW (ref 36.0–46.0)
Hemoglobin: 10.9 g/dL — ABNORMAL LOW (ref 12.0–15.0)
Lymphocytes Relative: 45 %
Lymphs Abs: 1.5 10*3/uL (ref 0.7–4.0)
MCH: 29.5 pg (ref 26.0–34.0)
MCHC: 33 g/dL (ref 30.0–36.0)
MCV: 89.2 fL (ref 80.0–100.0)
Monocytes Absolute: 0.3 10*3/uL (ref 0.1–1.0)
Monocytes Relative: 8 %
Neutro Abs: 1.6 10*3/uL — ABNORMAL LOW (ref 1.7–7.7)
Neutrophils Relative %: 46 %
Platelets: 243 10*3/uL (ref 150–400)
RBC: 3.7 MIL/uL — ABNORMAL LOW (ref 3.87–5.11)
RDW: 15.1 % (ref 11.5–15.5)
Smear Review: ADEQUATE
WBC: 3.4 10*3/uL — ABNORMAL LOW (ref 4.0–10.5)
nRBC: 0 % (ref 0.0–0.2)

## 2019-06-03 LAB — RETICULOCYTES
Immature Retic Fract: 8.1 % (ref 2.3–15.9)
RBC.: 3.7 MIL/uL — ABNORMAL LOW (ref 3.87–5.11)
Retic Count, Absolute: 47.4 10*3/uL (ref 19.0–186.0)
Retic Ct Pct: 1.3 % (ref 0.4–3.1)

## 2019-06-03 LAB — TECHNOLOGIST SMEAR REVIEW

## 2019-06-03 LAB — VITAMIN B12: Vitamin B-12: 822 pg/mL (ref 180–914)

## 2019-06-03 LAB — FOLATE: Folate: 15.5 ng/mL (ref >5.9)

## 2019-06-03 LAB — FERRITIN: Ferritin: 16 ng/mL (ref 11–307)

## 2019-06-03 LAB — TSH: TSH: 2.543 u[IU]/mL (ref 0.350–4.500)

## 2019-06-04 LAB — HAPTOGLOBIN: Haptoglobin: 150 mg/dL (ref 42–346)

## 2019-06-04 LAB — MULTIPLE MYELOMA PANEL, SERUM
Albumin SerPl Elph-Mcnc: 3.6 g/dL (ref 2.9–4.4)
Albumin/Glob SerPl: 1.1 (ref 0.7–1.7)
Alpha 1: 0.2 g/dL (ref 0.0–0.4)
Alpha2 Glob SerPl Elph-Mcnc: 0.8 g/dL (ref 0.4–1.0)
B-Globulin SerPl Elph-Mcnc: 1 g/dL (ref 0.7–1.3)
Gamma Glob SerPl Elph-Mcnc: 1.6 g/dL (ref 0.4–1.8)
Globulin, Total: 3.6 g/dL (ref 2.2–3.9)
IgA: 279 mg/dL (ref 64–422)
IgG (Immunoglobin G), Serum: 1924 mg/dL — ABNORMAL HIGH (ref 586–1602)
IgM (Immunoglobulin M), Srm: 31 mg/dL (ref 26–217)
M Protein SerPl Elph-Mcnc: 0.3 g/dL — ABNORMAL HIGH
Total Protein ELP: 7.2 g/dL (ref 6.0–8.5)

## 2019-06-04 LAB — KAPPA/LAMBDA LIGHT CHAINS
Kappa free light chain: 40.6 mg/L — ABNORMAL HIGH (ref 3.3–19.4)
Kappa, lambda light chain ratio: 1.4 (ref 0.26–1.65)
Lambda free light chains: 28.9 mg/L — ABNORMAL HIGH (ref 5.7–26.3)

## 2019-06-15 ENCOUNTER — Encounter: Payer: Self-pay | Admitting: Oncology

## 2019-06-15 ENCOUNTER — Inpatient Hospital Stay: Payer: Medicare HMO | Attending: Oncology | Admitting: Oncology

## 2019-06-15 DIAGNOSIS — D638 Anemia in other chronic diseases classified elsewhere: Secondary | ICD-10-CM

## 2019-06-15 DIAGNOSIS — D472 Monoclonal gammopathy: Secondary | ICD-10-CM

## 2019-06-15 DIAGNOSIS — R790 Abnormal level of blood mineral: Secondary | ICD-10-CM

## 2019-06-15 DIAGNOSIS — D709 Neutropenia, unspecified: Secondary | ICD-10-CM

## 2019-06-15 NOTE — Progress Notes (Signed)
The patient c/o bilateral knee pain and right foot pain ( pain level today 3). The patient Name and DON has been verified by phone today.

## 2019-06-16 NOTE — Progress Notes (Signed)
I connected with Brittney Clark on 06/16/19 at  2:15 PM EDT by video enabled telemedicine visit and verified that I am speaking with the correct person using two identifiers.   I discussed the limitations, risks, security and privacy concerns of performing an evaluation and management service by telemedicine and the availability of in-person appointments. I also discussed with the patient that there may be a patient responsible charge related to this service. The patient expressed understanding and agreed to proceed.  Other persons participating in the visit and their role in the encounter:  Patients daughter  Patient's location:  home Provider's location:  work  Risk analyst Complaint:  Discuss results of bloodwork  Diagnosis- 1. 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 IgG MGUS   Heme/Onc history: Patient is a 78 year old female with a history of autoimmune hepatitis diagnosed in 2005 who is on Imuran and sees [facility].  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  She is currently off prednisone.  She was also seen recently by rheumatology for symptoms of polyarthralgia.  During that time she had some blood work done which showed abnormal SPEP and hence the patient has been referred to [place]. Most recent CBC from 05/26/2019 showed white count of 2.6, H&H of 10.9/33.5 and a platelet count of 277.  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  She also has normocytic anemia and her baseline hemoglobin runs between 10-11.  Her white count has been a little lower than her baseline of 3-4 since she came off prednisone.  Patient mainly currently reports joint pain and some fatigue  Results of blood work from 06/03/2019 were as follows: CBC showed white count of 3.4, H&H of 10.9/33 with an MCV of 89 and platelet  count of 243.  ANC was 1.6.  Smear review showed occasional smudge cells and some abnormal lymphocytes.  Ferritin levels were low at 16.  Iron studies B12 folate was normal.  TSH was normal reticulocyte count was 1.3 mildly low for the degree of anemia.  Haptoglobin was normal at 150.  Multiple myeloma panel showed IgG lambda M protein of 0.3 g.  Both kappa and lambda free light chains were elevated with a normal free light chain ratio of 1.4.  Interval history- other than chronic joint pain she denies any complaints at this time    Review of Systems  Constitutional: Positive for malaise/fatigue. Negative for chills, fever and weight loss.  HENT: Negative for congestion, ear discharge and nosebleeds.   Eyes: Negative for blurred vision.  Respiratory: Negative for cough, hemoptysis, sputum production, shortness of breath and wheezing.   Cardiovascular: Negative for chest pain, palpitations, orthopnea and claudication.  Gastrointestinal: Negative for abdominal pain, blood in stool, constipation, diarrhea, heartburn, melena, nausea and vomiting.  Genitourinary: Negative for dysuria, flank pain, frequency, hematuria and urgency.  Musculoskeletal: Positive for joint pain. Negative for back pain and myalgias.  Skin: Negative for rash.  Neurological: Negative for dizziness, tingling, focal weakness, seizures, weakness and headaches.  Endo/Heme/Allergies: Does not bruise/bleed easily.  Psychiatric/Behavioral: Negative for depression and suicidal ideas. The patient does not have insomnia.     Allergies  Allergen Reactions  . Amlodipine Other (See Comments)    Hair loss  . Amlodipine Besy-Benazepril Hcl Other (See Comments)    Hair loss  . Clonidine Other (See Comments)  Hair loss  . Diltiazem Other (See Comments)    Hair loss  . Hydralazine Other (See Comments)  . Penicillins Rash    Has patient had a PCN reaction causing immediate rash, facial/tongue/throat swelling, SOB or lightheadedness  with hypotension: No Has patient had a PCN reaction causing severe rash involving mucus membranes or skin necrosis: No Has patient had a PCN reaction that required hospitalization No Has patient had a PCN reaction occurring within the last 10 years: No If all of the above answers are "NO", then may proceed with Cephalosporin use.     Past Medical History:  Diagnosis Date  . Anemia   . Arthritis   . Autoimmune hepatitis ([place])   . DDD (degenerative disc disease), lumbar   . GERD (gastroesophageal reflux disease)   . Hypertension   . Immune deficiency disorder ([place])   . Internal hemorrhoids   . Leukopenia   . Venous stasis     Past Surgical History:  Procedure Laterality Date  . ABDOMINAL HYSTERECTOMY    . APPENDECTOMY    . BREAST BIOPSY Left 1980   neg  . BREAST SURGERY    . LEFT OOPHORECTOMY    . MAXIMUM ACCESS (MAS)POSTERIOR LUMBAR INTERBODY FUSION (PLIF) 1 LEVEL N/A 10/27/2015   Procedure: FOR MAXIMUM ACCESS (MAS) POSTERIOR LUMBAR INTERBODY FUSION (PLIF) L5-S1 MAS with resection of synovial cyst;  Surgeon: Erline Levine, MD;  Location: [place] [facility];  Service: Neurosurgery;  Laterality: N/A;  . NECK SURGERY    . ROTATOR CUFF REPAIR      Social History   Socioeconomic History  . Marital status: Married    Spouse name: Not on file  . Number of children: Not on file  . Years of education: Not on file  . Highest education level: Not on file  Occupational History  . Not on file  Social Needs  . Financial resource strain: Not on file  . Food insecurity    Worry: Not on file    Inability: Not on file  . Transportation needs    Medical: Not on file    Non-medical: Not on file  Tobacco Use  . Smoking status: Never Smoker  . Smokeless tobacco: Never Used  Substance and Sexual Activity  . Alcohol use: No  . Drug use: Never  . Sexual activity: Yes  Lifestyle  . Physical activity    Days per week: Not on file    Minutes per session: Not on file  . Stress: Not on  file  Relationships  . Social Herbalist on phone: Not on file    Gets together: Not on file    Attends religious service: Not on file    Active member of club or organization: Not on file    Attends meetings of clubs or organizations: Not on file    Relationship status: Not on file  . Intimate partner violence    Fear of current or ex partner: Not on file    Emotionally abused: Not on file    Physically abused: Not on file    Forced sexual activity: Not on file  Other Topics Concern  . Not on file  Social History Narrative  . Not on file    Family History  Problem Relation Age of Onset  . Breast cancer Paternal Aunt   . Stroke Mother   . Aneurysm Father   . Stroke Paternal Grandfather   . Diabetes Neg Hx      Current  Outpatient Medications:  .  aspirin EC 81 MG tablet, Take 81 mg by mouth daily., Disp: , Rfl:  .  azaTHIOprine (IMURAN) 50 MG tablet, Take 50 mg by mouth daily., Disp: , Rfl:  .  calcium carbonate (OS-CAL - DOSED IN MG OF ELEMENTAL CALCIUM) 1250 (500 CA) MG tablet, Take 1 tablet by mouth daily., Disp: , Rfl:  .  carvedilol (COREG) 25 MG tablet, Take 25 mg by mouth 2 (two) times daily with a meal., Disp: , Rfl:  .  diltiazem (DILACOR XR) 240 MG 24 hr capsule, Take 240 mg by mouth daily., Disp: , Rfl:  .  isosorbide mononitrate (IMDUR) 120 MG 24 hr tablet, Take 120 mg by mouth daily., Disp: , Rfl:  .  vitamin B-12 (CYANOCOBALAMIN) 500 MCG tablet, Take 500 mcg by mouth daily., Disp: , Rfl:  .  docusate sodium (COLACE) 100 MG capsule, Take 100 mg by mouth 2 (two) times daily., Disp: , Rfl:  .  fexofenadine (ALLEGRA) 180 MG tablet, Take 180 mg by mouth daily as needed (before allergy injection)., Disp: , Rfl:  .  meloxicam (MOBIC) 7.5 MG tablet, Take 7.5 mg by mouth daily., Disp: , Rfl:  .  PRESCRIPTION MEDICATION, Inject 1 Dose as directed once a week. Patient uses allergy shots on Tuesday, Disp: , Rfl:   No results found.  No images are attached to  the encounter.   CMP Latest Ref Rng & Units 10/19/2015  Glucose 65 - 99 mg/dL 96  BUN 6 - 20 mg/dL 13  Creatinine 0.44 - 1.00 mg/dL 1.08(H)  Sodium 135 - 145 mmol/L 137  Potassium 3.5 - 5.1 mmol/L 3.2(L)  Chloride 101 - 111 mmol/L 100(L)  CO2 22 - 32 mmol/L 28  Calcium 8.9 - 10.3 mg/dL 9.7  Total Protein 6.5 - 8.1 g/dL -  Total Bilirubin 0.3 - 1.2 mg/dL -  Alkaline Phos 38 - 126 U/L -  AST 15 - 41 U/L -  ALT 14 - 54 U/L -   CBC Latest Ref Rng & Units 06/03/2019  WBC 4.0 - 10.5 K/uL 3.4(L)  Hemoglobin 12.0 - 15.0 g/dL 10.9(L)  Hematocrit 36.0 - 46.0 % 33.0(L)  Platelets 150 - 400 K/uL 243     Observation/objective:appears in no acute distress over video visit today. Breathing is non labored   Assessment and plan:patient is a 78 yr old female with h/o autoimmune hepatitis on imuran referred for MGUS and neutropenia  1. Neutropenia: possibly due to imuran. I am inclined to monitor this conservatively without bone marrow biopsy at this time. If her wbc/ anc decreases with time I will conisder BM bx. Repeat cbc with diff in 3 months  2. Normocytic anemia: possibly due to chronic disease. Her ferritin is also low at 16. Patient prefers trial of oral iron over IV iron at this time. She will take OTC iron sulfate 325 mg once or twice daily. Repeat ferritin and iron studies in 3 months  3. MGUS: she has a small IgG lambda paraprotein of 0.3 gm. No AKI/ hypercalcemia. Will monitor Q6 months  Follow-up instructions: cbc ferritin and iron studies in 3 months and see MD  I discussed the assessment and treatment plan with the patient. The patient was provided an opportunity to ask questions and all were answered. The patient agreed with the plan and demonstrated an understanding of the instructions.   The patient was advised to call back or seek an in-person evaluation if the symptoms worsen or if the condition  fails to improve as anticipated.    Visit Diagnosis: 1. Neutropenia,  unspecified type ([place])   2. Low ferritin   3. Anemia of chronic disease   4. MGUS (monoclonal gammopathy of unknown significance)     Dr. Randa Evens, MD, MPH [facility] at [facility] Pager- [phone] 06/16/2019 9:10 AM

## 2019-06-16 NOTE — Progress Notes (Deleted)
Hematology/Oncology Consult note [facility]  [phone] [phone]  Patient Care Team: Adin Hector, MD as PCP - General (Internal Medicine)   Name of the patient: Brittney Clark  [id number]  May 19, 1941   Date of visit: 06/16/19  Diagnosis- 1. 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 IgG MGUS  Chief complaint/ Reason for visit- discuss results of bloodwork  Heme/Onc history: Patient is a 78 year old female with a history of autoimmune hepatitis diagnosed in 2005 who is on Imuran and sees [facility].  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  She is currently off prednisone.  She was also seen recently by rheumatology for symptoms of polyarthralgia.  During that time she had some blood work done which showed abnormal SPEP and hence the patient has been referred to [place]. Most recent CBC from 05/26/2019 showed white count of 2.6, H&H of 10.9/33.5 and a platelet count of 277.  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  She also has normocytic anemia and her baseline hemoglobin runs between 10-11.  Her white count has been a little lower than her baseline of 3-4 since she came off prednisone.  Patient mainly currently reports joint pain and some fatigue  Results of blood work from 05/29/2019 were as follows: CBC showed white count of three-point  Interval history- ***  ECOG PS- *** Pain scale- *** Opioid associated constipation- ***  Review of systems- ROS   Current treatment- ***  Allergies  Allergen Reactions  . Amlodipine Other (See Comments)    Hair loss  . Amlodipine Besy-Benazepril Hcl Other (See Comments)    Hair loss  . Clonidine Other (See Comments)    Hair loss  . Diltiazem Other (See Comments)    Hair loss  .  Hydralazine Other (See Comments)  . Penicillins Rash    Has patient had a PCN reaction causing immediate rash, facial/tongue/throat swelling, SOB or lightheadedness with hypotension: No Has patient had a PCN reaction causing severe rash involving mucus membranes or skin necrosis: No Has patient had a PCN reaction that required hospitalization No Has patient had a PCN reaction occurring within the last 10 years: No If all of the above answers are "NO", then may proceed with Cephalosporin use.      Past Medical History:  Diagnosis Date  . Anemia   . Arthritis   . Autoimmune hepatitis ([place])   . DDD (degenerative disc disease), lumbar   . GERD (gastroesophageal reflux disease)   . Hypertension   . Immune deficiency disorder ([place])   . Internal hemorrhoids   . Leukopenia   . Venous stasis      Past Surgical History:  Procedure Laterality Date  . ABDOMINAL HYSTERECTOMY    . APPENDECTOMY    . BREAST BIOPSY Left 1980   neg  . BREAST SURGERY    . LEFT OOPHORECTOMY    . MAXIMUM ACCESS (MAS)POSTERIOR LUMBAR INTERBODY FUSION (PLIF) 1 LEVEL N/A 10/27/2015   Procedure: FOR MAXIMUM ACCESS (MAS) POSTERIOR LUMBAR INTERBODY FUSION (PLIF) L5-S1 MAS with resection of synovial cyst;  Surgeon: Erline Levine, MD;  Location: [place];  Service: Neurosurgery;  Laterality: N/A;  . NECK SURGERY    . ROTATOR CUFF REPAIR      Social History   Socioeconomic History  .  Marital status: Married    Spouse name: Not on file  . Number of children: Not on file  . Years of education: Not on file  . Highest education level: Not on file  Occupational History  . Not on file  Social Needs  . Financial resource strain: Not on file  . Food insecurity    Worry: Not on file    Inability: Not on file  . Transportation needs    Medical: Not on file    Non-medical: Not on file  Tobacco Use  . Smoking status: Never Smoker  . Smokeless tobacco: Never Used  Substance and Sexual Activity  . Alcohol use:  No  . Drug use: Never  . Sexual activity: Yes  Lifestyle  . Physical activity    Days per week: Not on file    Minutes per session: Not on file  . Stress: Not on file  Relationships  . Social Herbalist on phone: Not on file    Gets together: Not on file    Attends religious service: Not on file    Active member of club or organization: Not on file    Attends meetings of clubs or organizations: Not on file    Relationship status: Not on file  . Intimate partner violence    Fear of current or ex partner: Not on file    Emotionally abused: Not on file    Physically abused: Not on file    Forced sexual activity: Not on file  Other Topics Concern  . Not on file  Social History Narrative  . Not on file    Family History  Problem Relation Age of Onset  . Breast cancer Paternal Aunt   . Stroke Mother   . Aneurysm Father   . Stroke Paternal Grandfather   . Diabetes Neg Hx      Current Outpatient Medications:  .  aspirin EC 81 MG tablet, Take 81 mg by mouth daily., Disp: , Rfl:  .  azaTHIOprine (IMURAN) 50 MG tablet, Take 50 mg by mouth daily., Disp: , Rfl:  .  calcium carbonate (OS-CAL - DOSED IN MG OF ELEMENTAL CALCIUM) 1250 (500 CA) MG tablet, Take 1 tablet by mouth daily., Disp: , Rfl:  .  carvedilol (COREG) 25 MG tablet, Take 25 mg by mouth 2 (two) times daily with a meal., Disp: , Rfl:  .  diltiazem (DILACOR XR) 240 MG 24 hr capsule, Take 240 mg by mouth daily., Disp: , Rfl:  .  isosorbide mononitrate (IMDUR) 120 MG 24 hr tablet, Take 120 mg by mouth daily., Disp: , Rfl:  .  vitamin B-12 (CYANOCOBALAMIN) 500 MCG tablet, Take 500 mcg by mouth daily., Disp: , Rfl:  .  docusate sodium (COLACE) 100 MG capsule, Take 100 mg by mouth 2 (two) times daily., Disp: , Rfl:  .  fexofenadine (ALLEGRA) 180 MG tablet, Take 180 mg by mouth daily as needed (before allergy injection)., Disp: , Rfl:  .  meloxicam (MOBIC) 7.5 MG tablet, Take 7.5 mg by mouth daily., Disp: , Rfl:  .   PRESCRIPTION MEDICATION, Inject 1 Dose as directed once a week. Patient uses allergy shots on Tuesday, Disp: , Rfl:   Physical exam: There were no vitals filed for this visit. Physical Exam   CMP Latest Ref Rng & Units 10/19/2015  Glucose 65 - 99 mg/dL 96  BUN 6 - 20 mg/dL 13  Creatinine 0.44 - 1.00 mg/dL 1.08(H)  Sodium 135 -  145 mmol/L 137  Potassium 3.5 - 5.1 mmol/L 3.2(L)  Chloride 101 - 111 mmol/L 100(L)  CO2 22 - 32 mmol/L 28  Calcium 8.9 - 10.3 mg/dL 9.7  Total Protein 6.5 - 8.1 g/dL -  Total Bilirubin 0.3 - 1.2 mg/dL -  Alkaline Phos 38 - 126 U/L -  AST 15 - 41 U/L -  ALT 14 - 54 U/L -   CBC Latest Ref Rng & Units 06/03/2019  WBC 4.0 - 10.5 K/uL 3.4(L)  Hemoglobin 12.0 - 15.0 g/dL 10.9(L)  Hematocrit 36.0 - 46.0 % 33.0(L)  Platelets 150 - 400 K/uL 243    No images are attached to the encounter.  No results found.   Assessment and plan- Patient is a 78 y.o. female ***   Visit Diagnosis 1. Spondylolisthesis of lumbar region      Dr. Randa Evens, MD, MPH [facility] at [facility] [phone] 06/16/2019 8:52 AM

## 2019-07-14 ENCOUNTER — Other Ambulatory Visit: Payer: Self-pay | Admitting: Internal Medicine

## 2019-07-14 DIAGNOSIS — Z1231 Encounter for screening mammogram for malignant neoplasm of breast: Secondary | ICD-10-CM

## 2019-07-28 ENCOUNTER — Ambulatory Visit
Admission: RE | Admit: 2019-07-28 | Discharge: 2019-07-28 | Disposition: A | Discharge status: Home / Self Care | Payer: Medicare HMO | Source: Ambulatory Visit | Attending: Internal Medicine | Admitting: Internal Medicine

## 2019-07-28 ENCOUNTER — Other Ambulatory Visit: Payer: Self-pay

## 2019-07-28 DIAGNOSIS — Z1231 Encounter for screening mammogram for malignant neoplasm of breast: Secondary | ICD-10-CM | POA: Diagnosis present

## 2019-08-27 ENCOUNTER — Other Ambulatory Visit
Admission: RE | Admit: 2019-08-27 | Discharge: 2019-08-27 | Disposition: A | Discharge status: Home / Self Care | Payer: Medicare HMO | Source: Ambulatory Visit | Attending: Physician Assistant | Admitting: Physician Assistant

## 2019-08-27 DIAGNOSIS — E7849 Other hyperlipidemia: Secondary | ICD-10-CM | POA: Insufficient documentation

## 2019-08-27 DIAGNOSIS — R0789 Other chest pain: Secondary | ICD-10-CM | POA: Insufficient documentation

## 2019-08-27 LAB — TROPONIN I (HIGH SENSITIVITY): Troponin I (High Sensitivity): 8 ng/L (ref <18)

## 2019-09-01 ENCOUNTER — Other Ambulatory Visit: Payer: Self-pay | Admitting: Gastroenterology

## 2019-09-01 ENCOUNTER — Other Ambulatory Visit (HOSPITAL_COMMUNITY): Payer: Self-pay | Admitting: Gastroenterology

## 2019-09-01 DIAGNOSIS — K754 Autoimmune hepatitis: Secondary | ICD-10-CM

## 2019-09-06 ENCOUNTER — Ambulatory Visit
Admission: RE | Admit: 2019-09-06 | Discharge: 2019-09-06 | Disposition: A | Discharge status: Home / Self Care | Payer: Medicare HMO | Source: Ambulatory Visit | Attending: Gastroenterology | Admitting: Gastroenterology

## 2019-09-06 ENCOUNTER — Other Ambulatory Visit: Payer: Self-pay

## 2019-09-06 DIAGNOSIS — K754 Autoimmune hepatitis: Secondary | ICD-10-CM | POA: Diagnosis present

## 2019-09-10 ENCOUNTER — Encounter: Payer: Self-pay | Admitting: Oncology

## 2019-09-17 ENCOUNTER — Inpatient Hospital Stay (HOSPITAL_BASED_OUTPATIENT_CLINIC_OR_DEPARTMENT_OTHER): Payer: Medicare HMO | Admitting: Oncology

## 2019-09-17 ENCOUNTER — Other Ambulatory Visit: Payer: Self-pay

## 2019-09-17 ENCOUNTER — Encounter: Payer: Self-pay | Admitting: Oncology

## 2019-09-17 ENCOUNTER — Inpatient Hospital Stay: Payer: Medicare HMO | Attending: Oncology

## 2019-09-17 VITALS — BP 132/77 | HR 55 | Temp 98.3°F | Wt 149.0 lb

## 2019-09-17 DIAGNOSIS — I878 Other specified disorders of veins: Secondary | ICD-10-CM | POA: Insufficient documentation

## 2019-09-17 DIAGNOSIS — D709 Neutropenia, unspecified: Secondary | ICD-10-CM | POA: Diagnosis present

## 2019-09-17 DIAGNOSIS — D649 Anemia, unspecified: Secondary | ICD-10-CM | POA: Insufficient documentation

## 2019-09-17 DIAGNOSIS — K754 Autoimmune hepatitis: Secondary | ICD-10-CM | POA: Insufficient documentation

## 2019-09-17 DIAGNOSIS — K449 Diaphragmatic hernia without obstruction or gangrene: Secondary | ICD-10-CM | POA: Insufficient documentation

## 2019-09-17 DIAGNOSIS — M810 Age-related osteoporosis without current pathological fracture: Secondary | ICD-10-CM | POA: Insufficient documentation

## 2019-09-17 DIAGNOSIS — D638 Anemia in other chronic diseases classified elsewhere: Secondary | ICD-10-CM

## 2019-09-17 DIAGNOSIS — D72819 Decreased white blood cell count, unspecified: Secondary | ICD-10-CM | POA: Insufficient documentation

## 2019-09-17 DIAGNOSIS — K5790 Diverticulosis of intestine, part unspecified, without perforation or abscess without bleeding: Secondary | ICD-10-CM | POA: Insufficient documentation

## 2019-09-17 DIAGNOSIS — E785 Hyperlipidemia, unspecified: Secondary | ICD-10-CM | POA: Insufficient documentation

## 2019-09-17 DIAGNOSIS — Z79899 Other long term (current) drug therapy: Secondary | ICD-10-CM | POA: Insufficient documentation

## 2019-09-17 LAB — CBC WITH DIFFERENTIAL/PLATELET
Abs Immature Granulocytes: 0.04 10*3/uL (ref 0.00–0.07)
Basophils Absolute: 0 10*3/uL (ref 0.0–0.1)
Basophils Relative: 1 %
Eosinophils Absolute: 0 10*3/uL (ref 0.0–0.5)
Eosinophils Relative: 0 %
HCT: 28.7 % — ABNORMAL LOW (ref 36.0–46.0)
Hemoglobin: 9.4 g/dL — ABNORMAL LOW (ref 12.0–15.0)
Immature Granulocytes: 1 %
Lymphocytes Relative: 17 %
Lymphs Abs: 0.8 10*3/uL (ref 0.7–4.0)
MCH: 30.2 pg (ref 26.0–34.0)
MCHC: 32.8 g/dL (ref 30.0–36.0)
MCV: 92.3 fL (ref 80.0–100.0)
Monocytes Absolute: 0.5 10*3/uL (ref 0.1–1.0)
Monocytes Relative: 10 %
Neutro Abs: 3.2 10*3/uL (ref 1.7–7.7)
Neutrophils Relative %: 71 %
Platelets: 194 10*3/uL (ref 150–400)
RBC: 3.11 MIL/uL — ABNORMAL LOW (ref 3.87–5.11)
RDW: 16.2 % — ABNORMAL HIGH (ref 11.5–15.5)
WBC: 4.5 10*3/uL (ref 4.0–10.5)
nRBC: 0 % (ref 0.0–0.2)

## 2019-09-17 LAB — FERRITIN: Ferritin: 42 ng/mL (ref 11–307)

## 2019-09-17 LAB — IRON AND TIBC
Iron: 113 ug/dL (ref 28–170)
Saturation Ratios: 35 % — ABNORMAL HIGH (ref 10.4–31.8)
TIBC: 320 ug/dL (ref 250–450)
UIBC: 207 ug/dL

## 2019-09-17 NOTE — Progress Notes (Signed)
Patient stated that she had been doing well with no complaints. 

## 2019-09-17 NOTE — Progress Notes (Signed)
Hematology/Oncology Consult note [facility]  Telephone:([phone] [phone]  Patient Care Team: Adin Hector, MD as PCP - General (Internal Medicine)   Name of the patient: Brittney Clark  [id number]  [birth year]   Date of visit: 09/17/19  Diagnosis-  1. 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 IgG MGUS   Chief complaint/ Reason for visit-routine follow-up of anemia and neutropenia  Heme/Onc history: Patient is a 78 year old female with a history of autoimmune hepatitis diagnosed in 2005 who is on Imuran and sees [facility]. 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 She is currently off prednisone. She was also seen recently by rheumatology for symptoms of polyarthralgia. During that time she had some blood work done which showed abnormal SPEP and hence the patient has been referred to [place]. 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 She also has normocytic anemia and her baseline hemoglobin runs between 10-11. Her white count has been a little lower than her baseline of 3-4 since she came off prednisone. Patient mainly currently reports joint pain and some fatigue  Results of blood work from 06/03/2019 were as follows: CBC showed white count of 3.4, H&H of 10.9/33 with an MCV of 89 and platelet count of 243.  ANC was 1.6.  Smear review showed occasional smudge cells and some abnormal lymphocytes.  Ferritin levels were low at 16.  Iron studies B12 folate was normal.  TSH was normal reticulocyte count was 1.3 mildly low for the degree of anemia.  Haptoglobin was normal at 150.  Multiple myeloma panel showed IgG lambda M protein of 0.3 g.  Both  kappa and lambda free light chains were elevated with a normal free light chain ratio of 1.4.  Interval history-overall she feels well and denies any complaints at this time.  Appetite and weight have remained stable.  ECOG PS- 1 Pain scale- 0   Review of systems- Review of Systems  Constitutional: Negative for chills, fever, malaise/fatigue and weight loss.  HENT: Negative for congestion, ear discharge and nosebleeds.   Eyes: Negative for blurred vision.  Respiratory: Negative for cough, hemoptysis, sputum production, shortness of breath and wheezing.   Cardiovascular: Negative for chest pain, palpitations, orthopnea and claudication.  Gastrointestinal: Negative for abdominal pain, blood in stool, constipation, diarrhea, heartburn, melena, nausea and vomiting.  Genitourinary: Negative for dysuria, flank pain, frequency, hematuria and urgency.  Musculoskeletal: Negative for back pain, joint pain and myalgias.  Skin: Negative for rash.  Neurological: Negative for dizziness, tingling, focal weakness, seizures, weakness and headaches.  Endo/Heme/Allergies: Does not bruise/bleed easily.  Psychiatric/Behavioral: Negative for depression and suicidal ideas. The patient does not have insomnia.        Allergies  Allergen Reactions   Amlodipine Other (See Comments)    Hair loss   Amlodipine Besy-Benazepril Hcl Other (See Comments)    Hair loss   Clonidine Other (See Comments)    Hair loss   Diltiazem Other (See Comments)    Hair loss   Hydralazine Other (See Comments)   Penicillins Rash    Has patient had a PCN reaction causing immediate rash, facial/tongue/throat swelling, SOB or lightheadedness with hypotension: No Has patient had a PCN  reaction causing severe rash involving mucus membranes or skin necrosis: No Has patient had a PCN reaction that required hospitalization No Has patient had a PCN reaction occurring within the last 10 years: No If all of the above answers are  "NO", then may proceed with Cephalosporin use.      Past Medical History:  Diagnosis Date   Anemia    Arthritis    Autoimmune hepatitis ([place])    DDD (degenerative disc disease), lumbar    GERD (gastroesophageal reflux disease)    Hypertension    Immune deficiency disorder ([place])    Internal hemorrhoids    Leukopenia    Venous stasis      Past Surgical History:  Procedure Laterality Date   ABDOMINAL HYSTERECTOMY     APPENDECTOMY     BREAST BIOPSY Left 1980   neg   BREAST SURGERY     LEFT OOPHORECTOMY     MAXIMUM ACCESS (MAS)POSTERIOR LUMBAR INTERBODY FUSION (PLIF) 1 LEVEL N/A 10/27/2015   Procedure: FOR MAXIMUM ACCESS (MAS) POSTERIOR LUMBAR INTERBODY FUSION (PLIF) L5-S1 MAS with resection of synovial cyst;  Surgeon: Erline Levine, MD;  Location: [facility];  Service: Neurosurgery;  Laterality: N/A;   NECK SURGERY     ROTATOR CUFF REPAIR      Social History   Socioeconomic History   Marital status: Married    Spouse name: Not on file   Number of children: Not on file   Years of education: Not on file   Highest education level: Not on file  Occupational History   Not on file  Social Needs   Financial resource strain: Not on file   Food insecurity    Worry: Not on file    Inability: Not on file   Transportation needs    Medical: Not on file    Non-medical: Not on file  Tobacco Use   Smoking status: Never Smoker   Smokeless tobacco: Never Used  Substance and Sexual Activity   Alcohol use: No   Drug use: Never   Sexual activity: Yes  Lifestyle   Physical activity    Days per week: Not on file    Minutes per session: Not on file   Stress: Not on file  Relationships   Social connections    Talks on phone: Not on file    Gets together: Not on file    Attends religious service: Not on file    Active member of club or organization: Not on file    Attends meetings of clubs or organizations: Not on file    Relationship  status: Not on file   Intimate partner violence    Fear of current or ex partner: Not on file    Emotionally abused: Not on file    Physically abused: Not on file    Forced sexual activity: Not on file  Other Topics Concern   Not on file  Social History Narrative   Not on file    Family History  Problem Relation Age of Onset   Breast cancer Paternal Aunt    Stroke Mother    Aneurysm Father    Stroke Paternal Grandfather    Diabetes Neg Hx      Current Outpatient Medications:    aspirin EC 81 MG tablet, Take 81 mg by mouth daily., Disp: , Rfl:    azaTHIOprine (IMURAN) 50 MG tablet, Take 50 mg by mouth daily., Disp: , Rfl:    calcium carbonate (OS-CAL - DOSED IN MG  OF ELEMENTAL CALCIUM) 1250 (500 CA) MG tablet, Take 1 tablet by mouth daily., Disp: , Rfl:    carvedilol (COREG) 25 MG tablet, Take 25 mg by mouth 2 (two) times daily with a meal., Disp: , Rfl:    diltiazem (DILACOR XR) 240 MG 24 hr capsule, Take 240 mg by mouth daily., Disp: , Rfl:    docusate sodium (COLACE) 100 MG capsule, Take 100 mg by mouth 2 (two) times daily., Disp: , Rfl:    fexofenadine (ALLEGRA) 180 MG tablet, Take 180 mg by mouth daily as needed (before allergy injection)., Disp: , Rfl:    isosorbide mononitrate (IMDUR) 120 MG 24 hr tablet, Take 120 mg by mouth daily., Disp: , Rfl:    meloxicam (MOBIC) 7.5 MG tablet, Take 7.5 mg by mouth daily., Disp: , Rfl:    PRESCRIPTION MEDICATION, Inject 1 Dose as directed once a week. Patient uses allergy shots on Tuesday, Disp: , Rfl:    vitamin B-12 (CYANOCOBALAMIN) 500 MCG tablet, Take 500 mcg by mouth daily., Disp: , Rfl:   Physical exam:  Vitals:   09/17/19 1328  BP: 132/77  Pulse: (!) 55  Temp: 98.3 F (36.8 C)  TempSrc: Tympanic  Weight: 149 lb (67.6 kg)   Physical Exam HENT:     Head: Normocephalic and atraumatic.  Eyes:     Pupils: Pupils are equal, round, and reactive to light.  Neck:     Musculoskeletal: Normal range of  motion.  Cardiovascular:     Rate and Rhythm: Normal rate and regular rhythm.     Heart sounds: Normal heart sounds.  Pulmonary:     Effort: Pulmonary effort is normal.     Breath sounds: Normal breath sounds.  Abdominal:     General: Bowel sounds are normal.     Palpations: Abdomen is soft.  Skin:    General: Skin is warm and dry.  Neurological:     Mental Status: She is alert and oriented to person, place, and time.      CMP Latest Ref Rng & Units 10/19/2015  Glucose 65 - 99 mg/dL 96  BUN 6 - 20 mg/dL 13  Creatinine 0.44 - 1.00 mg/dL 1.08(H)  Sodium 135 - 145 mmol/L 137  Potassium 3.5 - 5.1 mmol/L 3.2(L)  Chloride 101 - 111 mmol/L 100(L)  CO2 22 - 32 mmol/L 28  Calcium 8.9 - 10.3 mg/dL 9.7  Total Protein 6.5 - 8.1 g/dL -  Total Bilirubin 0.3 - 1.2 mg/dL -  Alkaline Phos 38 - 126 U/L -  AST 15 - 41 U/L -  ALT 14 - 54 U/L -   CBC Latest Ref Rng & Units 09/17/2019  WBC 4.0 - 10.5 K/uL 4.5  Hemoglobin 12.0 - 15.0 g/dL 9.4(L)  Hematocrit 36.0 - 46.0 % 28.7(L)  Platelets 150 - 400 K/uL 194    No images are attached to the encounter.  US Abdomen Complete  Result Date: 09/06/2019 CLINICAL DATA:  Autoimmune hepatitis EXAM: ABDOMEN ULTRASOUND COMPLETE COMPARISON:  09/04/2018 FINDINGS: Gallbladder: No gallstones or wall thickening visualized. No sonographic Murphy sign noted by sonographer. Common bile duct: Diameter: Normal caliber, 4 mm Liver: No focal lesion identified. Within normal limits in parenchymal echogenicity. Portal vein is patent on color Doppler imaging with normal direction of blood flow towards the liver. IVC: No abnormality visualized. Pancreas: Visualized portion unremarkable. Spleen: Size and appearance within normal limits. Right Kidney: Length: 10.2 cm. Normal echotexture. No hydronephrosis. 4.9 cm upper pole cyst Left Kidney: Length: 10.4  cm. Echogenicity within normal limits. No mass or hydronephrosis visualized. Abdominal aorta: No aneurysm visualized. Other  findings: None. IMPRESSION: No focal hepatic abnormality. No acute findings. Electronically Signed   By: Rolm Baptise M.D.   On: 09/06/2019 10:00     Assessment and plan- Patient is a 78 y.o. female here for routine follow-up of neutropenia and anemia  1.  Neutropenia: It waxes and wanes and may be secondary to Imuran.  Regardless there is been no consistent downward trend in the neutropenia and he she therefore does not require any bone marrow biopsy at this time.Her white count today is normal at 4.5 with an [place] of 3.2.  Continue to monitor  2.  Normocytic anemia: Her hemoglobin normally fluctuates between 9-10.  She has had a complete anemia work-up which was consistent with anemia of chronic disease with a component of iron deficiency.  Her ferritin levels were low at 16 in June 2020.  She did not wish to take IV iron at that time and was continuing oral iron.  Iron studies from today are pending and her hemoglobin is not significantly better as compared to June.  If her iron levels come back low, we will proceed with IV iron.  Discussed risks and benefits of IV iron including all but not limited to headache, nausea, leg swelling and possible risk of infusion reaction.  Patient understands and agrees to proceed as planned.   Visit Diagnosis 1. Anemia of chronic disease   2. Neutropenia, unspecified type [facility])      Dr. Randa Evens, MD, MPH [facility] [phone] 09/17/2019 1:20 PM

## 2019-11-08 ENCOUNTER — Encounter: Payer: Self-pay | Admitting: Oncology

## 2019-12-16 ENCOUNTER — Other Ambulatory Visit: Payer: Self-pay | Admitting: Internal Medicine

## 2019-12-16 ENCOUNTER — Inpatient Hospital Stay: Payer: Medicare HMO | Attending: Oncology

## 2019-12-16 ENCOUNTER — Other Ambulatory Visit: Payer: Self-pay

## 2019-12-16 DIAGNOSIS — D649 Anemia, unspecified: Secondary | ICD-10-CM | POA: Diagnosis not present

## 2019-12-16 DIAGNOSIS — M4722 Other spondylosis with radiculopathy, cervical region: Secondary | ICD-10-CM

## 2019-12-16 DIAGNOSIS — D709 Neutropenia, unspecified: Secondary | ICD-10-CM | POA: Diagnosis present

## 2019-12-16 DIAGNOSIS — D638 Anemia in other chronic diseases classified elsewhere: Secondary | ICD-10-CM

## 2019-12-16 DIAGNOSIS — Z79899 Other long term (current) drug therapy: Secondary | ICD-10-CM | POA: Diagnosis not present

## 2019-12-16 LAB — CBC WITH DIFFERENTIAL/PLATELET
Abs Immature Granulocytes: 0.02 10*3/uL (ref 0.00–0.07)
Basophils Absolute: 0 10*3/uL (ref 0.0–0.1)
Basophils Relative: 1 %
Eosinophils Absolute: 0 10*3/uL (ref 0.0–0.5)
Eosinophils Relative: 0 %
HCT: 34.7 % — ABNORMAL LOW (ref 36.0–46.0)
Hemoglobin: 11 g/dL — ABNORMAL LOW (ref 12.0–15.0)
Immature Granulocytes: 0 %
Lymphocytes Relative: 13 %
Lymphs Abs: 0.6 10*3/uL — ABNORMAL LOW (ref 0.7–4.0)
MCH: 29.5 pg (ref 26.0–34.0)
MCHC: 31.7 g/dL (ref 30.0–36.0)
MCV: 93 fL (ref 80.0–100.0)
Monocytes Absolute: 0.3 10*3/uL (ref 0.1–1.0)
Monocytes Relative: 7 %
Neutro Abs: 3.7 10*3/uL (ref 1.7–7.7)
Neutrophils Relative %: 79 %
Platelets: 241 10*3/uL (ref 150–400)
RBC: 3.73 MIL/uL — ABNORMAL LOW (ref 3.87–5.11)
RDW: 14.6 % (ref 11.5–15.5)
WBC: 4.6 10*3/uL (ref 4.0–10.5)
nRBC: 0 % (ref 0.0–0.2)

## 2019-12-16 LAB — IRON AND TIBC
Iron: 100 ug/dL (ref 28–170)
Saturation Ratios: 22 % (ref 10.4–31.8)
TIBC: 462 ug/dL — ABNORMAL HIGH (ref 250–450)
UIBC: 362 ug/dL

## 2019-12-16 LAB — FERRITIN: Ferritin: 16 ng/mL (ref 11–307)

## 2019-12-17 ENCOUNTER — Telehealth: Payer: Self-pay

## 2019-12-17 ENCOUNTER — Inpatient Hospital Stay: Payer: Medicare HMO

## 2019-12-17 NOTE — Telephone Encounter (Signed)
-----   Message from Sindy Guadeloupe, MD sent at 12/17/2019  8:11 AM EST ----- She still has iron deficiency but she is only mildly anemic. We could wait and watch given rising covid and risk to her coming here and decide where to go in 3 months

## 2019-12-17 NOTE — Telephone Encounter (Signed)
Called patient to let her know what Dr. Janese Banks stated below. Patient understood and had no questions. Patient knows of her appointment in March 17, 2020.

## 2019-12-20 ENCOUNTER — Telehealth: Payer: Self-pay | Admitting: *Deleted

## 2019-12-20 NOTE — Telephone Encounter (Signed)
-----   Message from Sindy Guadeloupe, MD sent at 12/17/2019  8:11 AM EST ----- She still has iron deficiency but she is only mildly anemic. We could wait and watch given rising covid and risk to her coming here and decide where to go in 3 months

## 2019-12-20 NOTE — Telephone Encounter (Signed)
Called pt to explain about her lab results and she is a little anemic per dr Janese Banks but hgb better than it was 2 months ago. She suggests to monitor labs in 3 months due to the inc. Risk of covid right now. Pt agreeable to plan and told her if fatigue got worse or she started having any symptoms that iron was getting low then she could call our office sooner.

## 2019-12-28 ENCOUNTER — Ambulatory Visit
Admission: RE | Admit: 2019-12-28 | Discharge: 2019-12-28 | Disposition: A | Discharge status: Home / Self Care | Payer: Medicare HMO | Source: Ambulatory Visit | Attending: Internal Medicine | Admitting: Internal Medicine

## 2019-12-28 ENCOUNTER — Other Ambulatory Visit: Payer: Self-pay

## 2019-12-28 DIAGNOSIS — M4722 Other spondylosis with radiculopathy, cervical region: Secondary | ICD-10-CM | POA: Insufficient documentation

## 2019-12-31 NOTE — ED Provider Notes (Signed)
[facility]    CSN: [id number] Arrival date & time: 07/04/18  1512      History   Chief Complaint Chief Complaint  Patient presents with  . Cough    HPI Brittney Clark is a 79 y.o. female.   79 yo female with a c/o cough and chest congestion for the past week.    Cough   Past Medical History:  Diagnosis Date  . Anemia   . Arthritis   . Autoimmune hepatitis ([place])   . DDD (degenerative disc disease), lumbar   . GERD (gastroesophageal reflux disease)   . Hypertension   . Immune deficiency disorder ([place])   . Internal hemorrhoids   . Leukopenia   . Venous stasis     Patient Active Problem List   Diagnosis Date Noted  . Anemia 09/17/2019  . Autoimmune hepatitis ([place]) 09/17/2019  . Diverticulosis 09/17/2019  . Hiatal hernia 09/17/2019  . Hyperlipidemia 09/17/2019  . Leukopenia 09/17/2019  . 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 Venous stasis 09/17/2019  . Elevated erythrocyte sedimentation rate 05/14/2019  . Immunosuppression ([place]) 03/22/2019  . 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 Osteoarthritis of spine with radiculopathy, lumbar region 09/07/2015  . DDD (degenerative disc disease), lumbar 07/27/2015  . Lumbar radiculitis 07/27/2015  . Pneumonia 05/01/2015  . Hyponatremia 04/28/2015  . Essential hypertension 04/28/2015  . B12 deficiency 01/30/2015  . Cervical pain (neck) 10/21/2014  . Acquired cyst of kidney 09/20/2013  . Microscopic hematuria 09/20/2013    Past Surgical History:  Procedure Laterality Date  . ABDOMINAL HYSTERECTOMY    . APPENDECTOMY    . BREAST BIOPSY Left 1980   neg  . BREAST SURGERY    . LEFT OOPHORECTOMY    . MAXIMUM ACCESS (MAS)POSTERIOR LUMBAR INTERBODY FUSION (PLIF) 1 LEVEL N/A 10/27/2015   Procedure: FOR MAXIMUM ACCESS (MAS) POSTERIOR LUMBAR INTERBODY FUSION (PLIF) L5-S1 MAS with resection of synovial cyst;  Surgeon: Erline Levine, MD;  Location: [facility];  Service: Neurosurgery;   Laterality: N/A;  . NECK SURGERY    . ROTATOR CUFF REPAIR      OB History   No obstetric history on file.      Home Medications    Prior to Admission medications   Medication Sig Start Date End Date Taking? Authorizing Provider  aspirin EC 81 MG tablet Take 81 mg by mouth daily.   Yes [provider]  azaTHIOprine (IMURAN) 50 MG tablet Take 50 mg by mouth daily. 07/28/15  Yes [provider]  calcium carbonate (OS-CAL - DOSED IN MG OF ELEMENTAL CALCIUM) 1250 (500 CA) MG tablet Take 1 tablet by mouth daily.   Yes [provider]  carvedilol (COREG) 25 MG tablet Take 25 mg by mouth 2 (two) times daily with a meal.   Yes [provider]  diltiazem (DILACOR XR) 240 MG 24 hr capsule Take 240 mg by mouth daily.   Yes [provider]  fexofenadine (ALLEGRA) 180 MG tablet Take 180 mg by mouth daily as needed (before allergy injection).   Yes [provider]  isosorbide mononitrate (IMDUR) 120 MG 24 hr tablet Take 120 mg by mouth daily.   Yes [provider]  vitamin B-12 (CYANOCOBALAMIN) 500 MCG tablet Take 500 mcg by mouth daily.   Yes [provider]  ferrous sulfate 325 (65 FE) MG tablet Take 1 tablet by mouth 1 day or 1 dose.    [provider]    Family History Family History  Problem Relation Age of Onset  . Breast cancer Paternal Aunt   . Stroke Mother   . Aneurysm Father   . Stroke Paternal Grandfather   . Diabetes Neg Hx     Social History Social History   Tobacco Use  . Smoking status: Never Smoker  . Smokeless tobacco: Never Used  Substance Use Topics  . Alcohol use: No  . Drug use: Never     Allergies   Amlodipine, Amlodipine besy-benazepril hcl, Clonidine, Diltiazem, Hydralazine, and Penicillins   Review of Systems Review of Systems  Respiratory: Positive for cough.      Physical Exam Triage Vital Signs ED Triage Vitals  Enc Vitals Group     BP 07/04/18 1542 (!) 151/95      Pulse Rate 07/04/18 1542 60     Resp 07/04/18 1542 16     Temp 07/04/18 1542 98.7 F (37.1 C)     Temp Source 07/04/18 1542 Oral     SpO2 07/04/18 1542 99 %     Weight 07/04/18 1542 145 lb (65.8 kg)     Height 07/04/18 1542 5\' 5"  (1.651 m)     Head Circumference --      Peak Flow --      Pain Score 07/04/18 1541 3     Pain Loc --      Pain Edu? --      Excl. in [place]? --    No data found.  Updated Vital Signs BP (!) 151/95 (BP Location: Left Arm)   Pulse 60   Temp 98.7 F (37.1 C) (Oral)   Resp 16   Ht 5\' 5"  (1.651 m)   Wt 65.8 kg   SpO2 99%   BMI 24.13 kg/m   Visual Acuity Right Eye Distance:   Left Eye Distance:   Bilateral Distance:    Right Eye Near:   Left Eye Near:    Bilateral Near:     Physical Exam Vitals and nursing note reviewed.  Constitutional:      General: She is not in acute distress.    Appearance: She is not toxic-appearing or diaphoretic.  Cardiovascular:     Rate and Rhythm: Normal rate.     Pulses: Normal pulses.     Heart sounds: Normal heart sounds.  Pulmonary:     Effort: Pulmonary effort is normal. No respiratory distress.     Breath sounds: Rhonchi present.  Neurological:     Mental Status: She is alert.      UC Treatments / Results  Labs (all labs ordered are listed, but only abnormal results are displayed) Labs Reviewed - No data to display  EKG   Radiology No results found.  Procedures Procedures (including critical care time)  Medications Ordered in UC Medications - No data to display  Initial Impression / Assessment and Plan / UC Course  I have reviewed the triage vital signs and the nursing notes.  Pertinent labs & imaging results that were available during my care of the patient were reviewed by me and considered in my medical decision making (see chart for details).      Final Clinical Impressions(s) / UC Diagnoses   Final diagnoses:  Cough  Bronchitis  Immune deficiency disorder [facility])   Discharge  Instructions   None    ED Prescriptions    Medication Sig Dispense Auth. Provider   sulfamethoxazole-trimethoprim (BACTRIM DS,SEPTRA DS) 800-160 MG tablet Take 1 tablet by mouth 2 (two) times daily. 20 tablet Norval Gable, MD  chlorpheniramine-HYDROcodone (TUSSIONEX PENNKINETIC ER) 10-8 MG/5ML SUER Take 5 mLs by mouth every 12 (twelve) hours as needed. 60 mL Norval Gable, MD      1. Labs/x-ray results and diagnosis reviewed with patient/parent/guardian/family 2. rx as per orders above; reviewed possible side effects, interactions, risks and benefits  3. Recommend supportive treatment with  4. Follow-up prn if symptoms worsen or don't improve  PDMP not reviewed this encounter.   Norval Gable, MD 12/31/19 1859

## 2020-03-15 ENCOUNTER — Other Ambulatory Visit: Payer: Self-pay

## 2020-03-15 ENCOUNTER — Inpatient Hospital Stay: Payer: Medicare HMO | Attending: Oncology

## 2020-03-15 DIAGNOSIS — D472 Monoclonal gammopathy: Secondary | ICD-10-CM | POA: Diagnosis not present

## 2020-03-15 DIAGNOSIS — I10 Essential (primary) hypertension: Secondary | ICD-10-CM | POA: Insufficient documentation

## 2020-03-15 DIAGNOSIS — D509 Iron deficiency anemia, unspecified: Secondary | ICD-10-CM | POA: Insufficient documentation

## 2020-03-15 DIAGNOSIS — K219 Gastro-esophageal reflux disease without esophagitis: Secondary | ICD-10-CM | POA: Insufficient documentation

## 2020-03-15 DIAGNOSIS — M1990 Unspecified osteoarthritis, unspecified site: Secondary | ICD-10-CM | POA: Insufficient documentation

## 2020-03-15 DIAGNOSIS — D638 Anemia in other chronic diseases classified elsewhere: Secondary | ICD-10-CM

## 2020-03-15 DIAGNOSIS — D709 Neutropenia, unspecified: Secondary | ICD-10-CM | POA: Insufficient documentation

## 2020-03-15 DIAGNOSIS — Z79899 Other long term (current) drug therapy: Secondary | ICD-10-CM | POA: Insufficient documentation

## 2020-03-15 DIAGNOSIS — Z7982 Long term (current) use of aspirin: Secondary | ICD-10-CM | POA: Diagnosis not present

## 2020-03-15 LAB — CBC WITH DIFFERENTIAL/PLATELET
Abs Immature Granulocytes: 0 10*3/uL (ref 0.00–0.07)
Band Neutrophils: 1 %
Basophils Absolute: 0 10*3/uL (ref 0.0–0.1)
Basophils Relative: 0 %
Eosinophils Absolute: 0 10*3/uL (ref 0.0–0.5)
Eosinophils Relative: 0 %
HCT: 34.2 % — ABNORMAL LOW (ref 36.0–46.0)
Hemoglobin: 11 g/dL — ABNORMAL LOW (ref 12.0–15.0)
Lymphocytes Relative: 36 %
Lymphs Abs: 1.2 10*3/uL (ref 0.7–4.0)
MCH: 29.4 pg (ref 26.0–34.0)
MCHC: 32.2 g/dL (ref 30.0–36.0)
MCV: 91.4 fL (ref 80.0–100.0)
Monocytes Absolute: 0.4 10*3/uL (ref 0.1–1.0)
Monocytes Relative: 13 %
Neutro Abs: 1.7 10*3/uL (ref 1.7–7.7)
Neutrophils Relative %: 50 %
Platelets: 247 10*3/uL (ref 150–400)
RBC: 3.74 MIL/uL — ABNORMAL LOW (ref 3.87–5.11)
RDW: 16.4 % — ABNORMAL HIGH (ref 11.5–15.5)
Smear Review: ADEQUATE
WBC: 3.4 10*3/uL — ABNORMAL LOW (ref 4.0–10.5)
nRBC: 0 % (ref 0.0–0.2)

## 2020-03-15 LAB — FERRITIN: Ferritin: 14 ng/mL (ref 11–307)

## 2020-03-15 LAB — IRON AND TIBC
Iron: 70 ug/dL (ref 28–170)
Saturation Ratios: 17 % (ref 10.4–31.8)
TIBC: 414 ug/dL (ref 250–450)
UIBC: 344 ug/dL

## 2020-03-17 ENCOUNTER — Inpatient Hospital Stay (HOSPITAL_BASED_OUTPATIENT_CLINIC_OR_DEPARTMENT_OTHER): Payer: Medicare HMO | Admitting: Oncology

## 2020-03-17 ENCOUNTER — Inpatient Hospital Stay: Payer: Medicare HMO

## 2020-03-17 ENCOUNTER — Encounter: Payer: Self-pay | Admitting: Oncology

## 2020-03-17 DIAGNOSIS — D709 Neutropenia, unspecified: Secondary | ICD-10-CM

## 2020-03-17 DIAGNOSIS — D509 Iron deficiency anemia, unspecified: Secondary | ICD-10-CM

## 2020-03-17 NOTE — Progress Notes (Signed)
Patient is here for follow up she is doing well no concerns

## 2020-03-21 NOTE — Progress Notes (Signed)
I connected with Brittney Clark on 03/21/20 at  1:15 PM EDT by video enabled telemedicine visit and verified that I am speaking with the correct person using two identifiers.   I discussed the limitations, risks, security and privacy concerns of performing an evaluation and management service by telemedicine and the availability of in-person appointments. I also discussed with the patient that there may be a patient responsible charge related to this service. The patient expressed understanding and agreed to proceed.  Other persons participating in the visit and their role in the encounter:  none  Patient's location:  home Provider's location:  work  Risk analyst Complaint: Routine follow-up of anemia and neutropenia  Diagnosis- 1. 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 IgG MGUS  History of present illness: Patient is a 79 year old female with a history of autoimmune hepatitis diagnosed in 2005 who is on Imuran and sees [facility]. 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 She is currently off prednisone. She was also seen recently by rheumatology for symptoms of polyarthralgia. During that time she had some blood work done which showed abnormal SPEP and hence the patient has been referred to [place]. 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 She also has normocytic anemia and her baseline hemoglobin runs between 10-11. Her white count has been a little lower than her baseline of 3-4 since she came off prednisone. Patient mainly currently reports joint pain and some fatigue  Results of blood work from 06/03/2019 were as follows: CBC showed white count of 3.4, H&H of 10.9/33 with an MCV of 89 and  platelet count of 243. ANC was 1.6. Smear review showed occasional smudge cells and some abnormal lymphocytes. Ferritin levels were low at 16. Iron studies B12 folate was normal. TSH was normal reticulocyte count was 1.3 mildly low for the degree of anemia. Haptoglobin was normal at 150. Multiple myeloma panel showed IgG lambda M protein of 0.3 g. Both kappa and lambda free light chains were elevated with a normal free light chain ratio of 1.4.  Interval history: Patient reports doing well.  Denies any complaints at this time.  Energy levels are good and appetite is normal.  Review of Systems  Constitutional: Negative for chills, fever, malaise/fatigue and weight loss.  HENT: Negative for congestion, ear discharge and nosebleeds.   Eyes: Negative for blurred vision.  Respiratory: Negative for cough, hemoptysis, sputum production, shortness of breath and wheezing.   Cardiovascular: Negative for chest pain, palpitations, orthopnea and claudication.  Gastrointestinal: Negative for abdominal pain, blood in stool, constipation, diarrhea, heartburn, melena, nausea and vomiting.  Genitourinary: Negative for dysuria, flank pain, frequency, hematuria and urgency.  Musculoskeletal: Negative for back pain, joint pain and myalgias.  Skin: Negative for rash.  Neurological: Negative for dizziness, tingling, focal weakness, seizures, weakness and headaches.  Endo/Heme/Allergies: Does not bruise/bleed easily.  Psychiatric/Behavioral: Negative for depression and suicidal ideas. The patient does not have insomnia.     Allergies  Allergen Reactions  . Amlodipine Other (See Comments)    Hair loss  . Amlodipine Besy-Benazepril Hcl Other (See Comments)    Hair loss  . Clonidine Other (See Comments)    Hair loss  . Diltiazem Other (See Comments)  Hair loss  . Hydralazine Other (See Comments)  . Penicillins Rash    Has patient had a PCN reaction causing immediate rash, facial/tongue/throat swelling,  SOB or lightheadedness with hypotension: No Has patient had a PCN reaction causing severe rash involving mucus membranes or skin necrosis: No Has patient had a PCN reaction that required hospitalization No Has patient had a PCN reaction occurring within the last 10 years: No If all of the above answers are "NO", then may proceed with Cephalosporin use.     Past Medical History:  Diagnosis Date  . Anemia   . Arthritis   . Autoimmune hepatitis ([place])   . DDD (degenerative disc disease), lumbar   . GERD (gastroesophageal reflux disease)   . Hypertension   . Immune deficiency disorder ([place])   . Internal hemorrhoids   . Leukopenia   . Venous stasis     Past Surgical History:  Procedure Laterality Date  . ABDOMINAL HYSTERECTOMY    . APPENDECTOMY    . BREAST BIOPSY Left 1980   neg  . BREAST SURGERY    . LEFT OOPHORECTOMY    . MAXIMUM ACCESS (MAS)POSTERIOR LUMBAR INTERBODY FUSION (PLIF) 1 LEVEL N/A 10/27/2015   Procedure: FOR MAXIMUM ACCESS (MAS) POSTERIOR LUMBAR INTERBODY FUSION (PLIF) L5-S1 MAS with resection of synovial cyst;  Surgeon: Erline Levine, MD;  Location: [facility];  Service: Neurosurgery;  Laterality: N/A;  . NECK SURGERY    . ROTATOR CUFF REPAIR      Social History   Socioeconomic History  . Marital status: Married    Spouse name: Not on file  . Number of children: Not on file  . Years of education: Not on file  . Highest education level: Not on file  Occupational History  . Not on file  Tobacco Use  . Smoking status: Never Smoker  . Smokeless tobacco: Never Used  Substance and Sexual Activity  . Alcohol use: No  . Drug use: Never  . Sexual activity: Yes  Other Topics Concern  . Not on file  Social History Narrative  . Not on file   Social Determinants of Health   Financial Resource Strain:   . Difficulty of Paying Living Expenses:   Food Insecurity:   . Worried About Charity fundraiser in the Last Year:   . Arboriculturist in the Last Year:    Transportation Needs:   . Film/video editor (Medical):   Marland Kitchen Lack of Transportation (Non-Medical):   Physical Activity:   . Days of Exercise per Week:   . Minutes of Exercise per Session:   Stress:   . Feeling of Stress :   Social Connections:   . Frequency of Communication with Friends and Family:   . Frequency of Social Gatherings with Friends and Family:   . Attends Religious Services:   . Active Member of Clubs or Organizations:   . Attends Archivist Meetings:   Marland Kitchen Marital Status:   Intimate Partner Violence:   . Fear of Current or Ex-Partner:   . Emotionally Abused:   Marland Kitchen Physically Abused:   . Sexually Abused:     Family History  Problem Relation Age of Onset  . Breast cancer Paternal Aunt   . Stroke Mother   . Aneurysm Father   . Stroke Paternal Grandfather   . Diabetes Neg Hx      Current Outpatient Medications:  .  aspirin EC 81 MG tablet, Take 81 mg by mouth daily., Disp: ,  Rfl:  .  azaTHIOprine (IMURAN) 50 MG tablet, Take 50 mg by mouth daily., Disp: , Rfl:  .  calcium carbonate (OS-CAL - DOSED IN MG OF ELEMENTAL CALCIUM) 1250 (500 CA) MG tablet, Take 1 tablet by mouth daily., Disp: , Rfl:  .  carvedilol (COREG) 25 MG tablet, Take 25 mg by mouth 2 (two) times daily with a meal., Disp: , Rfl:  .  diltiazem (DILACOR XR) 240 MG 24 hr capsule, Take 240 mg by mouth daily., Disp: , Rfl:  .  fexofenadine (ALLEGRA) 180 MG tablet, Take 180 mg by mouth daily as needed (before allergy injection)., Disp: , Rfl:  .  isosorbide mononitrate (IMDUR) 120 MG 24 hr tablet, Take 120 mg by mouth daily., Disp: , Rfl:  .  vitamin B-12 (CYANOCOBALAMIN) 500 MCG tablet, Take 500 mcg by mouth daily., Disp: , Rfl:   No results found.  No images are attached to the encounter.   CMP Latest Ref Rng & Units 10/19/2015  Glucose 65 - 99 mg/dL 96  BUN 6 - 20 mg/dL 13  Creatinine 0.44 - 1.00 mg/dL 1.08(H)  Sodium 135 - 145 mmol/L 137  Potassium 3.5 - 5.1 mmol/L 3.2(L)   Chloride 101 - 111 mmol/L 100(L)  CO2 22 - 32 mmol/L 28  Calcium 8.9 - 10.3 mg/dL 9.7  Total Protein 6.5 - 8.1 g/dL -  Total Bilirubin 0.3 - 1.2 mg/dL -  Alkaline Phos 38 - 126 U/L -  AST 15 - 41 U/L -  ALT 14 - 54 U/L -   CBC Latest Ref Rng & Units 03/15/2020  WBC 4.0 - 10.5 K/uL 3.4(L)  Hemoglobin 12.0 - 15.0 g/dL 11.0(L)  Hematocrit 36.0 - 46.0 % 34.2(L)  Platelets 150 - 400 K/uL 247     Observation/objective: Appears in no acute distress over video visit today.  Breathing is nonlabored   Assessment and plan: Patient is a 79 year old female history of neutropenia and anemia here for routine follow-up  1.  Neutropenia: Patient's white cell count waxes and wanes Between 3-6.  Today her white cell count is 3.4 with an ANC of 1.7.  I suspect this is secondary to autoimmune etiology versus Imuran.  Continue to monitor.  No indication for bone marrow biopsy at this time  2.  Normocytic anemia: Hemoglobin has slowly improved to 11 presently.  It was between 9-10 in the recent past.  Ferritin levels are still low at 14 but iron studies were normal.  Patient would like to continue oral iron at this time.  She prefers to wait until receiving IV iron.  Repeat CBC ferritin and iron studies in 3 months in 6 months and I will see her back in 6 months   Follow-up instructions: As above  I discussed the assessment and treatment plan with the patient. The patient was provided an opportunity to ask questions and all were answered. The patient agreed with the plan and demonstrated an understanding of the instructions.   The patient was advised to call back or seek an in-person evaluation if the symptoms worsen or if the condition fails to improve as anticipated.  Visit Diagnosis: 1. Iron deficiency anemia, unspecified iron deficiency anemia type   2. Neutropenia, unspecified type ([place])     Dr. Randa Evens, MD, MPH [facility] Tel- [phone] 03/21/2020 9:38  AM

## 2020-04-10 ENCOUNTER — Encounter: Payer: Self-pay | Admitting: Oncology

## 2020-04-11 NOTE — Telephone Encounter (Signed)
No we will not know her iron results on that day. We will need to bring her in sometime later

## 2020-05-11 ENCOUNTER — Other Ambulatory Visit (HOSPITAL_COMMUNITY): Payer: Self-pay | Admitting: Internal Medicine

## 2020-05-11 ENCOUNTER — Other Ambulatory Visit: Payer: Self-pay | Admitting: Internal Medicine

## 2020-05-11 DIAGNOSIS — I10 Essential (primary) hypertension: Secondary | ICD-10-CM

## 2020-05-18 ENCOUNTER — Ambulatory Visit
Admission: RE | Admit: 2020-05-18 | Discharge: 2020-05-18 | Disposition: A | Discharge status: Home / Self Care | Payer: Medicare HMO | Source: Ambulatory Visit | Attending: Internal Medicine | Admitting: Internal Medicine

## 2020-05-18 ENCOUNTER — Other Ambulatory Visit: Payer: Self-pay

## 2020-05-18 DIAGNOSIS — I10 Essential (primary) hypertension: Secondary | ICD-10-CM

## 2020-05-18 DIAGNOSIS — N281 Cyst of kidney, acquired: Secondary | ICD-10-CM | POA: Diagnosis not present

## 2020-05-18 DIAGNOSIS — I700 Atherosclerosis of aorta: Secondary | ICD-10-CM | POA: Diagnosis not present

## 2020-06-16 ENCOUNTER — Inpatient Hospital Stay: Payer: Medicare HMO | Attending: Oncology

## 2020-06-16 ENCOUNTER — Other Ambulatory Visit: Payer: Self-pay

## 2020-06-16 DIAGNOSIS — D509 Iron deficiency anemia, unspecified: Secondary | ICD-10-CM

## 2020-06-16 DIAGNOSIS — D709 Neutropenia, unspecified: Secondary | ICD-10-CM | POA: Diagnosis present

## 2020-06-16 LAB — CBC WITH DIFFERENTIAL/PLATELET
Abs Immature Granulocytes: 0.03 10*3/uL (ref 0.00–0.07)
Basophils Absolute: 0 10*3/uL (ref 0.0–0.1)
Basophils Relative: 1 %
Eosinophils Absolute: 0 10*3/uL (ref 0.0–0.5)
Eosinophils Relative: 1 %
HCT: 32 % — ABNORMAL LOW (ref 36.0–46.0)
Hemoglobin: 11.2 g/dL — ABNORMAL LOW (ref 12.0–15.0)
Immature Granulocytes: 1 %
Lymphocytes Relative: 16 %
Lymphs Abs: 0.9 10*3/uL (ref 0.7–4.0)
MCH: 31.2 pg (ref 26.0–34.0)
MCHC: 35 g/dL (ref 30.0–36.0)
MCV: 89.1 fL (ref 80.0–100.0)
Monocytes Absolute: 0.4 10*3/uL (ref 0.1–1.0)
Monocytes Relative: 7 %
Neutro Abs: 4.5 10*3/uL (ref 1.7–7.7)
Neutrophils Relative %: 74 %
Platelets: 226 10*3/uL (ref 150–400)
RBC: 3.59 MIL/uL — ABNORMAL LOW (ref 3.87–5.11)
RDW: 15.8 % — ABNORMAL HIGH (ref 11.5–15.5)
WBC: 5.9 10*3/uL (ref 4.0–10.5)
nRBC: 0 % (ref 0.0–0.2)

## 2020-06-16 LAB — IRON AND TIBC
Iron: 38 ug/dL (ref 28–170)
Saturation Ratios: 11 % (ref 10.4–31.8)
TIBC: 358 ug/dL (ref 250–450)
UIBC: 320 ug/dL

## 2020-06-16 LAB — FERRITIN: Ferritin: 20 ng/mL (ref 11–307)

## 2020-06-20 ENCOUNTER — Encounter: Payer: Self-pay | Admitting: Oncology

## 2020-06-22 NOTE — Telephone Encounter (Signed)
We will give her venofer or feraheme based on what her insurance approves. Let me know

## 2020-06-23 ENCOUNTER — Other Ambulatory Visit: Payer: Self-pay | Admitting: Oncology

## 2020-06-23 DIAGNOSIS — D509 Iron deficiency anemia, unspecified: Secondary | ICD-10-CM | POA: Insufficient documentation

## 2020-06-26 ENCOUNTER — Other Ambulatory Visit: Payer: Self-pay

## 2020-06-26 ENCOUNTER — Inpatient Hospital Stay: Payer: Medicare HMO

## 2020-06-26 VITALS — BP 138/77 | HR 51 | Temp 98.1°F | Resp 16

## 2020-06-26 DIAGNOSIS — D509 Iron deficiency anemia, unspecified: Secondary | ICD-10-CM

## 2020-06-26 MED ORDER — SODIUM CHLORIDE 0.9 % IV SOLN
Freq: Once | INTRAVENOUS | Status: AC
  Filled 2020-06-26: qty 250

## 2020-06-26 MED ORDER — SODIUM CHLORIDE 0.9 % IV SOLN
300.0000 mg | INTRAVENOUS | Status: DC
  Administered 2020-06-26: 300 mg via INTRAVENOUS
  Filled 2020-06-26: qty 10

## 2020-06-26 NOTE — Progress Notes (Signed)
Patient tolerated Venofer infusion well. Monitored x 30 minutes post infusion. Patient denies any s/s. Discharged home.

## 2020-07-03 ENCOUNTER — Inpatient Hospital Stay: Payer: Medicare HMO

## 2020-07-03 ENCOUNTER — Other Ambulatory Visit: Payer: Self-pay

## 2020-07-03 VITALS — BP 148/80 | HR 52 | Temp 96.4°F | Resp 18

## 2020-07-03 DIAGNOSIS — D509 Iron deficiency anemia, unspecified: Secondary | ICD-10-CM

## 2020-07-03 MED ORDER — SODIUM CHLORIDE 0.9 % IV SOLN
300.0000 mg | INTRAVENOUS | Status: DC
  Administered 2020-07-03: 300 mg via INTRAVENOUS
  Filled 2020-07-03: qty 10

## 2020-07-03 MED ORDER — SODIUM CHLORIDE 0.9 % IV SOLN
Freq: Once | INTRAVENOUS | Status: AC
  Filled 2020-07-03: qty 250

## 2020-07-03 NOTE — Progress Notes (Signed)
Pt tolerated infusion well. No s/s of distress or reaction noted. Pt and VS stable at discharge.  

## 2020-07-04 ENCOUNTER — Other Ambulatory Visit: Payer: Self-pay | Admitting: Internal Medicine

## 2020-07-04 DIAGNOSIS — Z1231 Encounter for screening mammogram for malignant neoplasm of breast: Secondary | ICD-10-CM

## 2020-07-10 ENCOUNTER — Other Ambulatory Visit: Payer: Self-pay

## 2020-07-10 ENCOUNTER — Inpatient Hospital Stay: Payer: Medicare HMO | Attending: Oncology

## 2020-07-10 VITALS — BP 137/73 | HR 47 | Temp 95.6°F | Resp 16

## 2020-07-10 DIAGNOSIS — D649 Anemia, unspecified: Secondary | ICD-10-CM | POA: Diagnosis present

## 2020-07-10 DIAGNOSIS — D509 Iron deficiency anemia, unspecified: Secondary | ICD-10-CM

## 2020-07-10 MED ORDER — SODIUM CHLORIDE 0.9 % IV SOLN
Freq: Once | INTRAVENOUS | Status: AC
  Filled 2020-07-10: qty 250

## 2020-07-10 MED ORDER — SODIUM CHLORIDE 0.9 % IV SOLN
300.0000 mg | INTRAVENOUS | Status: DC
  Administered 2020-07-10: 300 mg via INTRAVENOUS
  Filled 2020-07-10: qty 15

## 2020-07-31 ENCOUNTER — Other Ambulatory Visit: Payer: Self-pay

## 2020-07-31 ENCOUNTER — Ambulatory Visit
Admission: RE | Admit: 2020-07-31 | Discharge: 2020-07-31 | Disposition: A | Discharge status: Home / Self Care | Payer: Medicare HMO | Source: Ambulatory Visit | Attending: Internal Medicine | Admitting: Internal Medicine

## 2020-07-31 DIAGNOSIS — Z1231 Encounter for screening mammogram for malignant neoplasm of breast: Secondary | ICD-10-CM | POA: Insufficient documentation

## 2020-09-14 ENCOUNTER — Other Ambulatory Visit: Payer: Self-pay

## 2020-09-14 ENCOUNTER — Inpatient Hospital Stay: Payer: Medicare HMO | Attending: Oncology

## 2020-09-14 DIAGNOSIS — D509 Iron deficiency anemia, unspecified: Secondary | ICD-10-CM

## 2020-09-14 DIAGNOSIS — D472 Monoclonal gammopathy: Secondary | ICD-10-CM | POA: Diagnosis present

## 2020-09-14 DIAGNOSIS — D708 Other neutropenia: Secondary | ICD-10-CM | POA: Diagnosis not present

## 2020-09-14 DIAGNOSIS — D709 Neutropenia, unspecified: Secondary | ICD-10-CM

## 2020-09-14 DIAGNOSIS — Z79899 Other long term (current) drug therapy: Secondary | ICD-10-CM | POA: Insufficient documentation

## 2020-09-14 LAB — CBC WITH DIFFERENTIAL/PLATELET
Abs Immature Granulocytes: 0.12 10*3/uL — ABNORMAL HIGH (ref 0.00–0.07)
Basophils Absolute: 0 10*3/uL (ref 0.0–0.1)
Basophils Relative: 0 %
Eosinophils Absolute: 0 10*3/uL (ref 0.0–0.5)
Eosinophils Relative: 0 %
HCT: 32.5 % — ABNORMAL LOW (ref 36.0–46.0)
Hemoglobin: 11.3 g/dL — ABNORMAL LOW (ref 12.0–15.0)
Immature Granulocytes: 2 %
Lymphocytes Relative: 12 %
Lymphs Abs: 0.9 10*3/uL (ref 0.7–4.0)
MCH: 32 pg (ref 26.0–34.0)
MCHC: 34.8 g/dL (ref 30.0–36.0)
MCV: 92.1 fL (ref 80.0–100.0)
Monocytes Absolute: 1.1 10*3/uL — ABNORMAL HIGH (ref 0.1–1.0)
Monocytes Relative: 15 %
Neutro Abs: 5.2 10*3/uL (ref 1.7–7.7)
Neutrophils Relative %: 71 %
Platelets: 236 10*3/uL (ref 150–400)
RBC: 3.53 MIL/uL — ABNORMAL LOW (ref 3.87–5.11)
RDW: 15.2 % (ref 11.5–15.5)
WBC: 7.4 10*3/uL (ref 4.0–10.5)
nRBC: 0 % (ref 0.0–0.2)

## 2020-09-14 LAB — IRON AND TIBC
Iron: 58 ug/dL (ref 28–170)
Saturation Ratios: 21 % (ref 10.4–31.8)
TIBC: 277 ug/dL (ref 250–450)
UIBC: 219 ug/dL

## 2020-09-14 LAB — FERRITIN: Ferritin: 297 ng/mL (ref 11–307)

## 2020-09-15 ENCOUNTER — Encounter: Payer: Self-pay | Admitting: Oncology

## 2020-09-15 ENCOUNTER — Ambulatory Visit: Payer: Self-pay

## 2020-09-15 ENCOUNTER — Ambulatory Visit
Admission: RE | Admit: 2020-09-15 | Discharge: 2020-09-15 | Disposition: A | Discharge status: Home / Self Care | Payer: Medicare HMO | Source: Ambulatory Visit | Attending: Emergency Medicine | Admitting: Emergency Medicine

## 2020-09-15 ENCOUNTER — Inpatient Hospital Stay (HOSPITAL_BASED_OUTPATIENT_CLINIC_OR_DEPARTMENT_OTHER): Payer: Medicare HMO | Admitting: Oncology

## 2020-09-15 ENCOUNTER — Ambulatory Visit
Admission: EM | Admit: 2020-09-15 | Discharge: 2020-09-15 | Disposition: A | Discharge status: Home / Self Care | Payer: Medicare HMO | Attending: Emergency Medicine | Admitting: Emergency Medicine

## 2020-09-15 ENCOUNTER — Ambulatory Visit
Admission: RE | Admit: 2020-09-15 | Discharge: 2020-09-15 | Disposition: A | Discharge status: Home / Self Care | Payer: Medicare HMO | Attending: Emergency Medicine | Admitting: Emergency Medicine

## 2020-09-15 DIAGNOSIS — D472 Monoclonal gammopathy: Secondary | ICD-10-CM

## 2020-09-15 DIAGNOSIS — D708 Other neutropenia: Secondary | ICD-10-CM

## 2020-09-15 DIAGNOSIS — M109 Gout, unspecified: Secondary | ICD-10-CM

## 2020-09-15 DIAGNOSIS — M79671 Pain in right foot: Secondary | ICD-10-CM

## 2020-09-15 DIAGNOSIS — D509 Iron deficiency anemia, unspecified: Secondary | ICD-10-CM | POA: Diagnosis not present

## 2020-09-15 MED ORDER — PREDNISONE 10 MG (21) PO TBPK
ORAL_TABLET | Freq: Every day | ORAL | 0 refills | Status: DC
Start: 2020-09-15 — End: 2021-03-16

## 2020-09-15 NOTE — ED Triage Notes (Addendum)
Pt reports having R foot pain and swelling that began yesterday morning. Hx of arthritis in foot but pain has never been this bad. No known injury to foot.

## 2020-09-15 NOTE — ED Provider Notes (Signed)
Brittney Clark    CSN: [id number] Arrival date & time: 09/15/20  0840      History   Chief Complaint Chief Complaint  Patient presents with  . Foot Swelling    HPI SURA CANUL is a 79 y.o. female.   Patient presents with right foot pain, swelling, warmth x1 day.  No known injury.  No open wounds.  No numbness, weakness, paresthesias.  Her medical history includes hypertension, anemia, cervical pain, DDD, osteoarthritis, diverticulosis, osteoporosis, venous stasis, GERD, hepatitis.  The history is provided by the patient.    Past Medical History:  Diagnosis Date  . Anemia   . Arthritis   . Autoimmune hepatitis ([place])   . DDD (degenerative disc disease), lumbar   . GERD (gastroesophageal reflux disease)   . Hypertension   . Immune deficiency disorder ([place])   . Internal hemorrhoids   . Leukopenia   . Venous stasis     Patient Active Problem List   Diagnosis Date Noted  . Iron deficiency anemia 06/23/2020  . Anemia 09/17/2019  . Autoimmune hepatitis ([place]) 09/17/2019  . Diverticulosis 09/17/2019  . Hiatal hernia 09/17/2019  . Hyperlipidemia 09/17/2019  . Leukopenia 09/17/2019  . 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 Venous stasis 09/17/2019  . Elevated erythrocyte sedimentation rate 05/14/2019  . Immunosuppression ([place]) 03/22/2019  . 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 Osteoarthritis of spine with radiculopathy, lumbar region 09/07/2015  . DDD (degenerative disc disease), lumbar 07/27/2015  . Lumbar radiculitis 07/27/2015  . Pneumonia 05/01/2015  . Hyponatremia 04/28/2015  . Essential hypertension 04/28/2015  . B12 deficiency 01/30/2015  . Cervical pain (neck) 10/21/2014  . Acquired cyst of kidney 09/20/2013  . Microscopic hematuria 09/20/2013    Past Surgical History:  Procedure Laterality Date  . ABDOMINAL HYSTERECTOMY    . APPENDECTOMY    . BREAST BIOPSY Left 1980   neg  . BREAST SURGERY    . LEFT OOPHORECTOMY    .  MAXIMUM ACCESS (MAS)POSTERIOR LUMBAR INTERBODY FUSION (PLIF) 1 LEVEL N/A 10/27/2015   Procedure: FOR MAXIMUM ACCESS (MAS) POSTERIOR LUMBAR INTERBODY FUSION (PLIF) L5-S1 MAS with resection of synovial cyst;  Surgeon: Erline Levine, MD;  Location: [facility];  Service: Neurosurgery;  Laterality: N/A;  . NECK SURGERY    . ROTATOR CUFF REPAIR      OB History   No obstetric history on file.      Home Medications    Prior to Admission medications   Medication Sig Start Date End Date Taking? Authorizing Provider  aspirin EC 81 MG tablet Take 81 mg by mouth daily.    [provider]  azaTHIOprine (IMURAN) 50 MG tablet Take 50 mg by mouth daily. 07/28/15   [provider]  calcium carbonate (OS-CAL - DOSED IN MG OF ELEMENTAL CALCIUM) 1250 (500 CA) MG tablet Take 1 tablet by mouth daily.    [provider]  carvedilol (COREG) 25 MG tablet Take 25 mg by mouth 2 (two) times daily with a meal.    [provider]  diltiazem (DILACOR XR) 240 MG 24 hr capsule Take 240 mg by mouth daily.    [provider]  fexofenadine (ALLEGRA) 180 MG tablet Take 180 mg by mouth daily as needed (before allergy injection).    [provider]  isosorbide mononitrate (IMDUR) 120 MG 24 hr tablet Take 120 mg by mouth daily.    [provider]  predniSONE (STERAPRED UNI-PAK 21 TAB) 10 MG (21) TBPK tablet Take by  mouth daily. As directed 09/15/20   Sharion Balloon, NP  vitamin B-12 (CYANOCOBALAMIN) 500 MCG tablet Take 500 mcg by mouth daily.    [provider]    Family History Family History  Problem Relation Age of Onset  . Breast cancer Paternal Aunt   . Stroke Mother   . Aneurysm Father   . Stroke Paternal Grandfather   . Diabetes Neg Hx     Social History Social History   Tobacco Use  . Smoking status: Never Smoker  . Smokeless tobacco: Never Used  Vaping Use  . Vaping Use: Never used  Substance Use Topics  . Alcohol use: No  . Drug  use: Never     Allergies   Amlodipine, Amlodipine besy-benazepril hcl, Clonidine, Diltiazem, Hydralazine, and Penicillins   Review of Systems Review of Systems  Constitutional: Negative for chills and fever.  HENT: Negative for ear pain and sore throat.   Eyes: Negative for pain and visual disturbance.  Respiratory: Negative for cough and shortness of breath.   Cardiovascular: Negative for chest pain and palpitations.  Gastrointestinal: Negative for abdominal pain and vomiting.  Genitourinary: Negative for dysuria and hematuria.  Musculoskeletal: Positive for arthralgias. Negative for back pain.  Skin: Negative for color change and rash.  Neurological: Negative for seizures, syncope, weakness and numbness.  All other systems reviewed and are negative.    Physical Exam Triage Vital Signs ED Triage Vitals  Enc Vitals Group     BP      Pulse      Resp      Temp      Temp src      SpO2      Weight      Height      Head Circumference      Peak Flow      Pain Score      Pain Loc      Pain Edu?      Excl. in [place]?    No data found.  Updated Vital Signs BP 124/76   Pulse 71   Temp 99.2 F (37.3 C) (Oral)   Resp 16   Ht 5\' 5"  (1.651 m)   Wt 148 lb (67.1 kg)   SpO2 98%   BMI 24.63 kg/m   Visual Acuity Right Eye Distance:   Left Eye Distance:   Bilateral Distance:    Right Eye Near:   Left Eye Near:    Bilateral Near:     Physical Exam Vitals and nursing note reviewed.  Constitutional:      General: She is not in acute distress.    Appearance: She is well-developed. She is not ill-appearing.  HENT:     Head: Normocephalic and atraumatic.     Mouth/Throat:     Mouth: Mucous membranes are moist.  Eyes:     Conjunctiva/sclera: Conjunctivae normal.  Cardiovascular:     Rate and Rhythm: Normal rate and regular rhythm.     Heart sounds: No murmur heard.   Pulmonary:     Effort: Pulmonary effort is normal. No respiratory distress.     Breath sounds:  Normal breath sounds.  Abdominal:     Palpations: Abdomen is soft.     Tenderness: There is no abdominal tenderness.  Musculoskeletal:        General: Swelling and tenderness present. No deformity or signs of injury. Normal range of motion.     Cervical back: Neck supple.       Feet:  Feet:     Comments: Top of right foot tender with mild edema, mild erythema; no lesions. Skin:    General: Skin is warm and dry.     Findings: Erythema present. No bruising, lesion or rash.  Neurological:     General: No focal deficit present.     Mental Status: She is alert and oriented to person, place, and time.     Sensory: No sensory deficit.     Motor: No weakness.     Gait: Gait normal.  Psychiatric:        Mood and Affect: Mood normal.        Behavior: Behavior normal.      UC Treatments / Results  Labs (all labs ordered are listed, but only abnormal results are displayed) Labs Reviewed - No data to display  EKG   Radiology DG Foot Complete Right  Result Date: 09/15/2020 CLINICAL DATA:  Pain and swelling EXAM: RIGHT FOOT COMPLETE - 3+ VIEW COMPARISON:  None. FINDINGS: Frontal, oblique, and lateral views obtained. No fracture or dislocation. Joint spaces appear normal. There is spurring in the dorsal midfoot. No erosive change. IMPRESSION: Focal spurring dorsal midfoot. No appreciable joint space narrowing. No fracture or dislocation. Electronically Signed   By: Lowella Grip III M.D.   On: 09/15/2020 09:56    Procedures Procedures (including critical care time)  Medications Ordered in UC Medications - No data to display  Initial Impression / Assessment and Plan / UC Course  I have reviewed the triage vital signs and the nursing notes.  Pertinent labs & imaging results that were available during my care of the patient were reviewed by me and considered in my medical decision making (see chart for details).   Right foot pain, right foot gout.  Xray negative for fracture or  dislocation. Treating with prednisone. Instructed patient to follow-up with her PCP if her symptoms are not improving. Patient agrees to plan of care.     Final Clinical Impressions(s) / UC Diagnoses   Final diagnoses:  Right foot pain  Acute gout of right foot, unspecified cause     Discharge Instructions     Go to [facility] for your xray.   I will call you with the results.     Your xray is negative.    Take the prednisone as directed.  Follow up with your primary care provider if your symptoms are not improving.            ED Prescriptions    Medication Sig Dispense Auth. Provider   predniSONE (STERAPRED UNI-PAK 21 TAB) 10 MG (21) TBPK tablet Take by mouth daily. As directed 21 tablet Sharion Balloon, NP     PDMP not reviewed this encounter.   Sharion Balloon, NP 09/15/20 1008

## 2020-09-15 NOTE — Discharge Instructions (Addendum)
Go to [facility] for your xray.   I will call you with the results.     Your xray is negative.    Take the prednisone as directed.  Follow up with your primary care provider if your symptoms are not improving.

## 2020-09-19 NOTE — Progress Notes (Signed)
I connected with Brittney Clark on 09/19/20 at  2:15 PM EDT by video enabled telemedicine visit and verified that I am speaking with the correct person using two identifiers.   I discussed the limitations, risks, security and privacy concerns of performing an evaluation and management service by telemedicine and the availability of in-person appointments. I also discussed with the patient that there may be a patient responsible charge related to this service. The patient expressed understanding and agreed to proceed.  Other persons participating in the visit and their role in the encounter:  none  Patient's location:  home Provider's location:  work  Diagnosis: 1. 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 IgG MGUS  Chief Complaint: Routine follow-up of anemia and neutropenia  History of present illness: Patient is a 79 year old female with a history of autoimmune hepatitis diagnosed in 2005 who is on Imuran and sees [facility]. 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 She is currently off prednisone. She was also seen recently by rheumatology for symptoms of polyarthralgia. During that time she had some blood work done which showed abnormal SPEP and hence the patient has been referred to [place]. 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 She also has normocytic anemia and her baseline hemoglobin runs between 10-11. Her white count has been a little lower than her baseline of 3-4 since she came off prednisone. Patient mainly currently reports joint pain and some fatigue  Results of blood work from 06/03/2019 were as follows: CBC showed white count of 3.4, H&H of 10.9/33 with an MCV of 89 and  platelet count of 243. ANC was 1.6. Smear review showed occasional smudge cells and some abnormal lymphocytes. Ferritin levels were low at 16. Iron studies B12 folate was normal. TSH was normal reticulocyte count was 1.3 mildly low for the degree of anemia. Haptoglobin was normal at 150. Multiple myeloma panel showed IgG lambda M protein of 0.3 g. Both kappa and lambda free light chains were elevated with a normal free light chain ratio of 1.4.  Interval history : Patient currently reports doing well and denies any complaints.  She received IV iron in July and August 2021 and reports some improvement in her energy levels at that time.   Review of Systems  Constitutional: Negative for chills, fever, malaise/fatigue and weight loss.  HENT: Negative for congestion, ear discharge and nosebleeds.   Eyes: Negative for blurred vision.  Respiratory: Negative for cough, hemoptysis, sputum production, shortness of breath and wheezing.   Cardiovascular: Negative for chest pain, palpitations, orthopnea and claudication.  Gastrointestinal: Negative for abdominal pain, blood in stool, constipation, diarrhea, heartburn, melena, nausea and vomiting.  Genitourinary: Negative for dysuria, flank pain, frequency, hematuria and urgency.  Musculoskeletal: Negative for back pain, joint pain and myalgias.  Skin: Negative for rash.  Neurological: Negative for dizziness, tingling, focal weakness, seizures, weakness and headaches.  Endo/Heme/Allergies: Does not bruise/bleed easily.  Psychiatric/Behavioral: Negative for depression and suicidal ideas. The patient does not have insomnia.     Allergies  Allergen Reactions  . Amlodipine Other (See Comments)    Hair loss  . Amlodipine Besy-Benazepril Hcl Other (See Comments)    Hair loss  . Clonidine Other (See Comments)  Hair loss  . Diltiazem Other (See Comments)    Hair loss  . Hydralazine Other (See Comments)  . Penicillins Rash    Has patient had a PCN  reaction causing immediate rash, facial/tongue/throat swelling, SOB or lightheadedness with hypotension: No Has patient had a PCN reaction causing severe rash involving mucus membranes or skin necrosis: No Has patient had a PCN reaction that required hospitalization No Has patient had a PCN reaction occurring within the last 10 years: No If all of the above answers are "NO", then may proceed with Cephalosporin use.     Past Medical History:  Diagnosis Date  . Anemia   . Arthritis   . Autoimmune hepatitis ([place])   . DDD (degenerative disc disease), lumbar   . GERD (gastroesophageal reflux disease)   . Gout   . Hypertension   . Immune deficiency disorder ([place])   . Internal hemorrhoids   . Leukopenia   . Venous stasis     Past Surgical History:  Procedure Laterality Date  . ABDOMINAL HYSTERECTOMY    . APPENDECTOMY    . BREAST BIOPSY Left 1980   neg  . BREAST SURGERY    . LEFT OOPHORECTOMY    . MAXIMUM ACCESS (MAS)POSTERIOR LUMBAR INTERBODY FUSION (PLIF) 1 LEVEL N/A 10/27/2015   Procedure: FOR MAXIMUM ACCESS (MAS) POSTERIOR LUMBAR INTERBODY FUSION (PLIF) L5-S1 MAS with resection of synovial cyst;  Surgeon: Erline Levine, MD;  Location: [facility];  Service: Neurosurgery;  Laterality: N/A;  . NECK SURGERY    . ROTATOR CUFF REPAIR      Social History   Socioeconomic History  . Marital status: Married    Spouse name: Not on file  . Number of children: Not on file  . Years of education: Not on file  . Highest education level: Not on file  Occupational History  . Not on file  Tobacco Use  . Smoking status: Never Smoker  . Smokeless tobacco: Never Used  Vaping Use  . Vaping Use: Never used  Substance and Sexual Activity  . Alcohol use: No  . Drug use: Never  . Sexual activity: Yes  Other Topics Concern  . Not on file  Social History Narrative  . Not on file   Social Determinants of Health   Financial Resource Strain:   . Difficulty of Paying Living Expenses: Not  on file  Food Insecurity:   . Worried About Charity fundraiser in the Last Year: Not on file  . Ran Out of Food in the Last Year: Not on file  Transportation Needs:   . Lack of Transportation (Medical): Not on file  . Lack of Transportation (Non-Medical): Not on file  Physical Activity:   . Days of Exercise per Week: Not on file  . Minutes of Exercise per Session: Not on file  Stress:   . Feeling of Stress : Not on file  Social Connections:   . Frequency of Communication with Friends and Family: Not on file  . Frequency of Social Gatherings with Friends and Family: Not on file  . Attends Religious Services: Not on file  . Active Member of Clubs or Organizations: Not on file  . Attends Archivist Meetings: Not on file  . Marital Status: Not on file  Intimate Partner Violence:   . Fear of Current or Ex-Partner: Not on file  . Emotionally Abused: Not on file  . Physically Abused: Not on file  . Sexually Abused: Not on file  Family History  Problem Relation Age of Onset  . Breast cancer Paternal Aunt   . Stroke Mother   . Aneurysm Father   . Stroke Paternal Grandfather   . Diabetes Neg Hx      Current Outpatient Medications:  .  aspirin EC 81 MG tablet, Take 81 mg by mouth daily., Disp: , Rfl:  .  azaTHIOprine (IMURAN) 50 MG tablet, Take 50 mg by mouth daily., Disp: , Rfl:  .  Calcium-Magnesium-Vitamin D (CALCIUM 1200+D3 PO), Take 1 tablet by mouth in the morning and at bedtime., Disp: , Rfl:  .  carvedilol (COREG) 25 MG tablet, Take 25 mg by mouth 2 (two) times daily with a meal., Disp: , Rfl:  .  diltiazem (DILACOR XR) 240 MG 24 hr capsule, Take 240 mg by mouth daily., Disp: , Rfl:  .  isosorbide mononitrate (IMDUR) 120 MG 24 hr tablet, Take 120 mg by mouth daily., Disp: , Rfl:  .  predniSONE (STERAPRED UNI-PAK 21 TAB) 10 MG (21) TBPK tablet, Take by mouth daily. As directed, Disp: 21 tablet, Rfl: 0 .  vitamin B-12 (CYANOCOBALAMIN) 500 MCG tablet, Take 500 mcg  by mouth daily., Disp: , Rfl:   DG Foot Complete Right  Result Date: 09/15/2020 CLINICAL DATA:  Pain and swelling EXAM: RIGHT FOOT COMPLETE - 3+ VIEW COMPARISON:  None. FINDINGS: Frontal, oblique, and lateral views obtained. No fracture or dislocation. Joint spaces appear normal. There is spurring in the dorsal midfoot. No erosive change. IMPRESSION: Focal spurring dorsal midfoot. No appreciable joint space narrowing. No fracture or dislocation. Electronically Signed   By: Lowella Grip III M.D.   On: 09/15/2020 09:56    No images are attached to the encounter.   CMP Latest Ref Rng & Units 10/19/2015  Glucose 65 - 99 mg/dL 96  BUN 6 - 20 mg/dL 13  Creatinine 0.44 - 1.00 mg/dL 1.08(H)  Sodium 135 - 145 mmol/L 137  Potassium 3.5 - 5.1 mmol/L 3.2(L)  Chloride 101 - 111 mmol/L 100(L)  CO2 22 - 32 mmol/L 28  Calcium 8.9 - 10.3 mg/dL 9.7  Total Protein 6.5 - 8.1 g/dL -  Total Bilirubin 0.3 - 1.2 mg/dL -  Alkaline Phos 38 - 126 U/L -  AST 15 - 41 U/L -  ALT 14 - 54 U/L -   CBC Latest Ref Rng & Units 09/14/2020  WBC 4.0 - 10.5 K/uL 7.4  Hemoglobin 12.0 - 15.0 g/dL 11.3(L)  Hematocrit 36 - 46 % 32.5(L)  Platelets 150 - 400 K/uL 236     Observation/objective: Appears in no acute distress over video visit today.  Breathing is nonlabored  Assessment and plan: Patient is a 79 year old female here for routine follow-up of anemia and neutropenia  1.  Neutropenia: White cell count faxes and wanes possibly secondary to Humira but her neutropenia overall has not been severe.  Today her white count is 7.4 with an ANC of 5.2.  2.  Normocytic anemia: Patient was noted to have iron deficiency back in July and received IV iron.  Her iron studies are now normal but there has been no significant improvement in her hemoglobin Which means that her anemia could be partly due to chronic disease.  3.  IgG MGUS: Patient was noted to have a small M protein of 0.3 IgG lambda back in June 2020.  I will  plan to repeat it at next visit  Follow-up instructions: Labs and see me in 6 months in person  I discussed  the assessment and treatment plan with the patient. The patient was provided an opportunity to ask questions and all were answered. The patient agreed with the plan and demonstrated an understanding of the instructions.   The patient was advised to call back or seek an in-person evaluation if the symptoms worsen or if the condition fails to improve as anticipated.   Visit Diagnosis: 1. Iron deficiency anemia, unspecified iron deficiency anemia type   2. MGUS (monoclonal gammopathy of unknown significance)   3. Other neutropenia ([place])     Dr. Randa Evens, MD, MPH [facility] at [facility] Tel- [phone] 09/19/2020 3:27 PM

## 2020-10-30 ENCOUNTER — Other Ambulatory Visit: Payer: Self-pay | Admitting: Gastroenterology

## 2020-10-30 DIAGNOSIS — K754 Autoimmune hepatitis: Secondary | ICD-10-CM

## 2020-11-09 ENCOUNTER — Ambulatory Visit: Payer: Medicare HMO

## 2020-11-10 ENCOUNTER — Ambulatory Visit
Admission: RE | Admit: 2020-11-10 | Discharge: 2020-11-10 | Disposition: A | Discharge status: Home / Self Care | Payer: Medicare HMO | Source: Ambulatory Visit | Attending: Gastroenterology | Admitting: Gastroenterology

## 2020-11-10 ENCOUNTER — Other Ambulatory Visit: Payer: Self-pay

## 2020-11-10 DIAGNOSIS — K754 Autoimmune hepatitis: Secondary | ICD-10-CM | POA: Diagnosis present

## 2021-01-16 ENCOUNTER — Other Ambulatory Visit (HOSPITAL_COMMUNITY): Payer: Self-pay | Admitting: Internal Medicine

## 2021-01-16 ENCOUNTER — Ambulatory Visit: Payer: Medicare HMO | Attending: Internal Medicine

## 2021-01-16 DIAGNOSIS — Z23 Encounter for immunization: Secondary | ICD-10-CM

## 2021-01-16 NOTE — Progress Notes (Signed)
   Covid-19 Vaccination Clinic  Name:  Brittney Clark    MRN: [id number] DOB: Apr 14, 1941  01/16/2021  Brittney Clark was observed post Covid-19 immunization for 15 minutes without incident. She was provided with Vaccine Information Sheet and instruction to access the V-Safe system.   Brittney Clark was instructed to call 911 with any severe reactions post vaccine: Marland Kitchen Difficulty breathing  . Swelling of face and throat  . A fast heartbeat  . A bad rash all over body  . Dizziness and weakness   Immunizations Administered    Name Date Dose VIS Date Route   PFIZER Comrnaty(Gray TOP) Covid-19 Vaccine 01/16/2021  2:24 PM 0.3 mL 11/16/2020 Intramuscular   Manufacturer: [place]   Lot: [id number]   NDC: [phone]

## 2021-03-16 ENCOUNTER — Inpatient Hospital Stay: Payer: Medicare HMO | Attending: Oncology

## 2021-03-16 ENCOUNTER — Inpatient Hospital Stay (HOSPITAL_BASED_OUTPATIENT_CLINIC_OR_DEPARTMENT_OTHER): Payer: Medicare HMO | Admitting: Oncology

## 2021-03-16 ENCOUNTER — Other Ambulatory Visit: Payer: Self-pay

## 2021-03-16 VITALS — BP 143/83 | HR 51 | Temp 97.6°F | Resp 20 | Wt 155.9 lb

## 2021-03-16 DIAGNOSIS — G8929 Other chronic pain: Secondary | ICD-10-CM | POA: Diagnosis not present

## 2021-03-16 DIAGNOSIS — I10 Essential (primary) hypertension: Secondary | ICD-10-CM | POA: Insufficient documentation

## 2021-03-16 DIAGNOSIS — Z90710 Acquired absence of both cervix and uterus: Secondary | ICD-10-CM | POA: Diagnosis not present

## 2021-03-16 DIAGNOSIS — D472 Monoclonal gammopathy: Secondary | ICD-10-CM

## 2021-03-16 DIAGNOSIS — Z9079 Acquired absence of other genital organ(s): Secondary | ICD-10-CM | POA: Insufficient documentation

## 2021-03-16 DIAGNOSIS — D709 Neutropenia, unspecified: Secondary | ICD-10-CM | POA: Diagnosis present

## 2021-03-16 DIAGNOSIS — Z803 Family history of malignant neoplasm of breast: Secondary | ICD-10-CM | POA: Diagnosis not present

## 2021-03-16 DIAGNOSIS — D509 Iron deficiency anemia, unspecified: Secondary | ICD-10-CM

## 2021-03-16 DIAGNOSIS — Z7952 Long term (current) use of systemic steroids: Secondary | ICD-10-CM | POA: Diagnosis not present

## 2021-03-16 DIAGNOSIS — Z7982 Long term (current) use of aspirin: Secondary | ICD-10-CM | POA: Diagnosis not present

## 2021-03-16 DIAGNOSIS — Z90721 Acquired absence of ovaries, unilateral: Secondary | ICD-10-CM | POA: Diagnosis not present

## 2021-03-16 DIAGNOSIS — Z79899 Other long term (current) drug therapy: Secondary | ICD-10-CM | POA: Diagnosis not present

## 2021-03-16 LAB — CBC
HCT: 37.6 % (ref 36.0–46.0)
Hemoglobin: 12.5 g/dL (ref 12.0–15.0)
MCH: 32.4 pg (ref 26.0–34.0)
MCHC: 33.2 g/dL (ref 30.0–36.0)
MCV: 97.4 fL (ref 80.0–100.0)
Platelets: 228 10*3/uL (ref 150–400)
RBC: 3.86 MIL/uL — ABNORMAL LOW (ref 3.87–5.11)
RDW: 14.8 % (ref 11.5–15.5)
WBC: 4.7 10*3/uL (ref 4.0–10.5)
nRBC: 0.4 % — ABNORMAL HIGH (ref 0.0–0.2)

## 2021-03-16 LAB — FERRITIN: Ferritin: 154 ng/mL (ref 11–307)

## 2021-03-16 LAB — IRON AND TIBC
Iron: 121 ug/dL (ref 28–170)
Saturation Ratios: 36 % — ABNORMAL HIGH (ref 10.4–31.8)
TIBC: 339 ug/dL (ref 250–450)
UIBC: 218 ug/dL

## 2021-03-16 NOTE — Progress Notes (Signed)
Hematology/Oncology Consult note [facility]  [phone] [phone]  Patient Care Team: Adin Hector, MD as PCP - General (Internal Medicine)   Name of the patient: Brittney Clark  [id number]  1941-04-16   Date of visit: 03/16/21  Diagnosis- 1. 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 IgG MGUS  Chief complaint/ Reason for visit-routine follow-up of neutropenia and anemia  Heme/Onc history: Patient is a 80 year old female with a history of autoimmune hepatitis diagnosed in 2005 who is on Imuran and sees [facility]. She was also seen recently by rheumatology for symptoms of polyarthralgia. During that time she had some blood work done which showed abnormal SPEP and hence the patient has been referred to [place]. 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 She also has normocytic anemia and her baseline hemoglobin runs between 10-11. Her white count has been a little lower than her baseline of 3-4 since she came off prednisone. Patient mainly currently reports joint pain and some fatigue  Results of blood work from 06/03/2019 were as follows: CBC showed white count of 3.4, H&H of 10.9/33 with an MCV of 89 and platelet count of 243. ANC was 1.6. Smear review showed occasional smudge cells and some abnormal lymphocytes. Ferritin levels were low at 16. Iron studies B12 folate was normal. TSH was normal reticulocyte count was 1.3 mildly low for the degree of anemia. Haptoglobin was normal at 150. Multiple myeloma panel showed IgG lambda M protein of 0.3 g. Both kappa and lambda free light chains were elevated with a normal free light chain ratio of 1.4.  Interval history-patient is doing well for  her age.  Denies any specific complaints at this time.  Appetite and weight have remained stable.  Denies any recurrent infections.  She has chronic joint pain and back on prednisone 5 mg.  ECOG PS- 1 Pain scale- 2 Opioid associated constipation- no  Review of systems- ROS    Allergies  Allergen Reactions  . Amlodipine Other (See Comments)    Hair loss  . Amlodipine Besy-Benazepril Hcl Other (See Comments)    Hair loss  . Clonidine Other (See Comments)    Hair loss  . Diltiazem Other (See Comments)    Hair loss  . Hydralazine Other (See Comments)  . Penicillins Rash    Has patient had a PCN reaction causing immediate rash, facial/tongue/throat swelling, SOB or lightheadedness with hypotension: No Has patient had a PCN reaction causing severe rash involving mucus membranes or skin necrosis: No Has patient had a PCN reaction that required hospitalization No Has patient had a PCN reaction occurring within the last 10 years: No If all of the above answers are "NO", then may proceed with Cephalosporin use.      Past Medical History:  Diagnosis Date  . Anemia   . Arthritis   . Autoimmune hepatitis ([place])   . DDD (degenerative disc disease), lumbar   . GERD (gastroesophageal reflux disease)   . Gout   . Hypertension   . Immune deficiency disorder ([place])   . Internal hemorrhoids   . Leukopenia   . Venous stasis      Past Surgical History:  Procedure Laterality Date  . ABDOMINAL HYSTERECTOMY    . APPENDECTOMY    .  BREAST BIOPSY Left 1980   neg  . BREAST SURGERY    . LEFT OOPHORECTOMY    . MAXIMUM ACCESS (MAS)POSTERIOR LUMBAR INTERBODY FUSION (PLIF) 1 LEVEL N/A 10/27/2015   Procedure: FOR MAXIMUM ACCESS (MAS) POSTERIOR LUMBAR INTERBODY FUSION (PLIF) L5-S1 MAS with resection of synovial cyst;  Surgeon: Erline Levine, MD;  Location: [place];  Service: Neurosurgery;  Laterality: N/A;  . NECK SURGERY    . ROTATOR CUFF REPAIR      Social History   Socioeconomic  History  . Marital status: Married    Spouse name: Not on file  . Number of children: Not on file  . Years of education: Not on file  . Highest education level: Not on file  Occupational History  . Not on file  Tobacco Use  . Smoking status: Never Smoker  . Smokeless tobacco: Never Used  Vaping Use  . Vaping Use: Never used  Substance and Sexual Activity  . Alcohol use: No  . Drug use: Never  . Sexual activity: Yes  Other Topics Concern  . Not on file  Social History Narrative  . Not on file   Social Determinants of Health   Financial Resource Strain: Not on file  Food Insecurity: Not on file  Transportation Needs: Not on file  Physical Activity: Not on file  Stress: Not on file  Social Connections: Not on file  Intimate Partner Violence: Not on file    Family History  Problem Relation Age of Onset  . Breast cancer Paternal Aunt   . Stroke Mother   . Aneurysm Father   . Stroke Paternal Grandfather   . Diabetes Neg Hx      Current Outpatient Medications:  .  aspirin EC 81 MG tablet, Take 81 mg by mouth daily., Disp: , Rfl:  .  azaTHIOprine (IMURAN) 50 MG tablet, Take 50 mg by mouth daily., Disp: , Rfl:  .  Calcium-Magnesium-Vitamin D (CALCIUM 1200+D3 PO), Take 1 tablet by mouth in the morning and at bedtime., Disp: , Rfl:  .  carvedilol (COREG) 25 MG tablet, Take 25 mg by mouth 2 (two) times daily with a meal., Disp: , Rfl:  .  COVID-19 mRNA Vac-TriS, Pfizer, SUSP injection, USE AS DIRECTED, Disp: .3 mL, Rfl: 0 .  diltiazem (DILACOR XR) 240 MG 24 hr capsule, Take 240 mg by mouth daily., Disp: , Rfl:  .  isosorbide mononitrate (IMDUR) 120 MG 24 hr tablet, Take 120 mg by mouth daily., Disp: , Rfl:  .  predniSONE (STERAPRED UNI-PAK 21 TAB) 10 MG (21) TBPK tablet, Take by mouth daily. As directed, Disp: 21 tablet, Rfl: 0 .  vitamin B-12 (CYANOCOBALAMIN) 500 MCG tablet, Take 500 mcg by mouth daily., Disp: , Rfl:   Physical exam: There were no vitals filed for this  visit. Physical Exam   CMP Latest Ref Rng & Units 10/19/2015  Glucose 65 - 99 mg/dL 96  BUN 6 - 20 mg/dL 13  Creatinine 0.44 - 1.00 mg/dL 1.08(H)  Sodium 135 - 145 mmol/L 137  Potassium 3.5 - 5.1 mmol/L 3.2(L)  Chloride 101 - 111 mmol/L 100(L)  CO2 22 - 32 mmol/L 28  Calcium 8.9 - 10.3 mg/dL 9.7  Total Protein 6.5 - 8.1 g/dL -  Total Bilirubin 0.3 - 1.2 mg/dL -  Alkaline Phos 38 - 126 U/L -  AST 15 - 41 U/L -  ALT 14 - 54 U/L -   CBC Latest Ref Rng & Units 03/16/2021  WBC 4.0 -  10.5 K/uL 4.7  Hemoglobin 12.0 - 15.0 g/dL 12.5  Hematocrit 36.0 - 46.0 % 37.6  Platelets 150 - 400 K/uL 228      Assessment and plan- Patient is a 80 y.o. female with chronic mild neutropenia and anemia here for routine follow-up  Neutropenia: Patient's white count waxes and wanes between 3.5-7.  Presently white count is 4.7 with a normal neutrophil count.  Hemoglobin is improved at 12.5.  Iron studies are currently pending.  She has received IV iron in the past.  Patient also has IgG MGUS and myeloma panel was checked today and is currently pending.  Repeat CBC ferritin and iron studies in 6 months in 1 year and I will see her back in 1 year.  Myeloma panel also to be checked in 1 year   Visit Diagnosis 1. MGUS (monoclonal gammopathy of unknown significance)   2. Iron deficiency anemia, unspecified iron deficiency anemia type      Dr. Randa Evens, MD, MPH [facility] at [facility] [phone] 03/16/2021 2:29 PM

## 2021-03-18 ENCOUNTER — Other Ambulatory Visit: Payer: Self-pay | Admitting: *Deleted

## 2021-03-18 DIAGNOSIS — D472 Monoclonal gammopathy: Secondary | ICD-10-CM

## 2021-03-19 LAB — MULTIPLE MYELOMA PANEL, SERUM
Albumin SerPl Elph-Mcnc: 3.4 g/dL (ref 2.9–4.4)
Albumin/Glob SerPl: 1 (ref 0.7–1.7)
Alpha 1: 0.2 g/dL (ref 0.0–0.4)
Alpha2 Glob SerPl Elph-Mcnc: 0.7 g/dL (ref 0.4–1.0)
B-Globulin SerPl Elph-Mcnc: 1 g/dL (ref 0.7–1.3)
Gamma Glob SerPl Elph-Mcnc: 1.5 g/dL (ref 0.4–1.8)
Globulin, Total: 3.5 g/dL (ref 2.2–3.9)
IgA: 274 mg/dL (ref 64–422)
IgG (Immunoglobin G), Serum: 1566 mg/dL (ref 586–1602)
IgM (Immunoglobulin M), Srm: 27 mg/dL (ref 26–217)
M Protein SerPl Elph-Mcnc: 0.4 g/dL — ABNORMAL HIGH
Total Protein ELP: 6.9 g/dL (ref 6.0–8.5)

## 2021-03-19 LAB — KAPPA/LAMBDA LIGHT CHAINS
Kappa free light chain: 27.3 mg/L — ABNORMAL HIGH (ref 3.3–19.4)
Kappa, lambda light chain ratio: 1.19 (ref 0.26–1.65)
Lambda free light chains: 22.9 mg/L (ref 5.7–26.3)

## 2021-07-11 ENCOUNTER — Other Ambulatory Visit: Payer: Self-pay | Admitting: Gastroenterology

## 2021-07-11 ENCOUNTER — Other Ambulatory Visit: Payer: Self-pay | Admitting: Internal Medicine

## 2021-07-11 DIAGNOSIS — K754 Autoimmune hepatitis: Secondary | ICD-10-CM

## 2021-07-11 DIAGNOSIS — Z1231 Encounter for screening mammogram for malignant neoplasm of breast: Secondary | ICD-10-CM

## 2021-07-30 ENCOUNTER — Ambulatory Visit: Payer: Medicare HMO

## 2021-07-30 ENCOUNTER — Other Ambulatory Visit: Payer: Self-pay

## 2021-07-30 ENCOUNTER — Ambulatory Visit
Admission: RE | Admit: 2021-07-30 | Discharge: 2021-07-30 | Disposition: A | Discharge status: Home / Self Care | Payer: Medicare HMO | Source: Ambulatory Visit | Attending: Gastroenterology | Admitting: Gastroenterology

## 2021-07-30 DIAGNOSIS — K754 Autoimmune hepatitis: Secondary | ICD-10-CM

## 2021-08-01 ENCOUNTER — Other Ambulatory Visit: Payer: Self-pay

## 2021-08-01 ENCOUNTER — Ambulatory Visit
Admission: RE | Admit: 2021-08-01 | Discharge: 2021-08-01 | Disposition: A | Discharge status: Home / Self Care | Payer: Medicare HMO | Source: Ambulatory Visit | Attending: Internal Medicine | Admitting: Internal Medicine

## 2021-08-01 DIAGNOSIS — Z1231 Encounter for screening mammogram for malignant neoplasm of breast: Secondary | ICD-10-CM | POA: Diagnosis not present

## 2021-09-11 ENCOUNTER — Encounter: Payer: Self-pay | Admitting: Oncology

## 2021-09-12 NOTE — Telephone Encounter (Signed)
Cbc ferritin and iron studies in 3 months and see me. Cancel appt for 10/10

## 2021-09-17 ENCOUNTER — Inpatient Hospital Stay: Payer: Medicare HMO

## 2021-12-05 ENCOUNTER — Other Ambulatory Visit: Payer: Self-pay | Admitting: *Deleted

## 2021-12-05 DIAGNOSIS — D509 Iron deficiency anemia, unspecified: Secondary | ICD-10-CM

## 2021-12-14 ENCOUNTER — Inpatient Hospital Stay: Payer: Medicare HMO | Attending: Oncology

## 2021-12-14 ENCOUNTER — Other Ambulatory Visit: Payer: Self-pay

## 2021-12-14 DIAGNOSIS — D649 Anemia, unspecified: Secondary | ICD-10-CM | POA: Diagnosis not present

## 2021-12-14 DIAGNOSIS — D472 Monoclonal gammopathy: Secondary | ICD-10-CM | POA: Diagnosis not present

## 2021-12-14 DIAGNOSIS — D509 Iron deficiency anemia, unspecified: Secondary | ICD-10-CM

## 2021-12-14 DIAGNOSIS — Z79899 Other long term (current) drug therapy: Secondary | ICD-10-CM | POA: Diagnosis not present

## 2021-12-14 LAB — CBC WITH DIFFERENTIAL/PLATELET
Abs Immature Granulocytes: 0.01 10*3/uL (ref 0.00–0.07)
Basophils Absolute: 0 10*3/uL (ref 0.0–0.1)
Basophils Relative: 0 %
Eosinophils Absolute: 0 10*3/uL (ref 0.0–0.5)
Eosinophils Relative: 0 %
HCT: 33.4 % — ABNORMAL LOW (ref 36.0–46.0)
Hemoglobin: 10.9 g/dL — ABNORMAL LOW (ref 12.0–15.0)
Immature Granulocytes: 0 %
Lymphocytes Relative: 9 %
Lymphs Abs: 0.4 10*3/uL — ABNORMAL LOW (ref 0.7–4.0)
MCH: 28.4 pg (ref 26.0–34.0)
MCHC: 32.6 g/dL (ref 30.0–36.0)
MCV: 87 fL (ref 80.0–100.0)
Monocytes Absolute: 0.3 10*3/uL (ref 0.1–1.0)
Monocytes Relative: 7 %
Neutro Abs: 4 10*3/uL (ref 1.7–7.7)
Neutrophils Relative %: 84 %
Platelets: 221 10*3/uL (ref 150–400)
RBC: 3.84 MIL/uL — ABNORMAL LOW (ref 3.87–5.11)
RDW: 16.6 % — ABNORMAL HIGH (ref 11.5–15.5)
WBC: 4.7 10*3/uL (ref 4.0–10.5)
nRBC: 0 % (ref 0.0–0.2)

## 2021-12-14 LAB — IRON AND TIBC
Iron: 52 ug/dL (ref 28–170)
Saturation Ratios: 13 % (ref 10.4–31.8)
TIBC: 417 ug/dL (ref 250–450)
UIBC: 365 ug/dL

## 2021-12-14 LAB — FERRITIN: Ferritin: 15 ng/mL (ref 11–307)

## 2021-12-14 NOTE — Progress Notes (Signed)
Called pt and she is willing to get iron treatment. She says that she had something at [facility] and had allergic reaction. She will look it up and send a message of the med. She states she had venofer at the clinic  here and she did good. She has no days that she already has appts , so far she is good to go. We will call her on Monday with appts.

## 2021-12-16 ENCOUNTER — Encounter: Payer: Self-pay | Admitting: Oncology

## 2021-12-17 ENCOUNTER — Encounter: Payer: Self-pay | Admitting: Oncology

## 2021-12-17 ENCOUNTER — Other Ambulatory Visit: Payer: Self-pay

## 2021-12-17 ENCOUNTER — Inpatient Hospital Stay: Payer: Medicare HMO | Admitting: Oncology

## 2021-12-17 VITALS — BP 159/86 | HR 63 | Temp 98.2°F | Resp 16 | Wt 147.5 lb

## 2021-12-17 DIAGNOSIS — D509 Iron deficiency anemia, unspecified: Secondary | ICD-10-CM | POA: Diagnosis not present

## 2021-12-17 DIAGNOSIS — D649 Anemia, unspecified: Secondary | ICD-10-CM | POA: Diagnosis not present

## 2021-12-17 NOTE — Progress Notes (Signed)
Hematology/Oncology Consult note [facility]  [phone] [phone]  Patient Care Team: Adin Hector, MD as PCP - General (Internal Medicine)   Name of the patient: Brittney Clark  [id number]  08-19-1941   Date of visit: 12/17/21  Diagnosis- 1. 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 IgG MGUS  Chief complaint/ Reason for visit-routine follow-up of anemia  Heme/Onc history: Patient is a 81 year old female with a history of autoimmune hepatitis diagnosed in 2005 who is on Imuran and sees [facility].  She was also seen recently by rheumatology for symptoms of polyarthralgia.  During that time she had some blood work done which showed abnormal SPEP and hence the patient has been referred to [place]. Most recent CBC from 05/26/2019 showed white count of 2.6, H&H of 10.9/33.5 and a platelet count of 277.  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  She also has normocytic anemia and her baseline hemoglobin runs between 10-11.  Her white count has been a little lower than her baseline of 3-4 since she came off prednisone.  Patient mainly currently reports joint pain and some fatigue   Results of blood work from 06/03/2019 were as follows: CBC showed white count of 3.4, H&H of 10.9/33 with an MCV of 89 and platelet count of 243.  ANC was 1.6.  Smear review showed occasional smudge cells and some abnormal lymphocytes.  Ferritin levels were low at 16.  Iron studies B12 folate was normal.  TSH was normal reticulocyte count was 1.3 mildly low for the degree of anemia.  Haptoglobin was normal at 150.  Multiple myeloma panel showed IgG lambda M protein of 0.3 g.  Both kappa and lambda free light chains were elevated with a normal free light chain ratio of 1.4.  Interval history-patient was hospitalized to [facility] for  diverticular bleed in October 2022.  She was given ferric gluconate for which she developed an allergic reaction.  Patient presently reports feeling better.  She does have some baseline fatigue  ECOG PS- 1 Pain scale- 0   Review of systems- Review of Systems  Constitutional:  Positive for malaise/fatigue. Negative for chills, fever and weight loss.  HENT:  Negative for congestion, ear discharge and nosebleeds.   Eyes:  Negative for blurred vision.  Respiratory:  Negative for cough, hemoptysis, sputum production, shortness of breath and wheezing.   Cardiovascular:  Negative for chest pain, palpitations, orthopnea and claudication.  Gastrointestinal:  Negative for abdominal pain, blood in stool, constipation, diarrhea, heartburn, melena, nausea and vomiting.  Genitourinary:  Negative for dysuria, flank pain, frequency, hematuria and urgency.  Musculoskeletal:  Negative for back pain, joint pain and myalgias.  Skin:  Negative for rash.  Neurological:  Negative for dizziness, tingling, focal weakness, seizures, weakness and headaches.  Endo/Heme/Allergies:  Does not bruise/bleed easily.  Psychiatric/Behavioral:  Negative for depression and suicidal ideas. The patient does not have insomnia.     Allergies  Allergen Reactions   Amlodipine Besy-Benazepril Hcl Other (See Comments)    Hair loss   Clonidine Other (See Comments)    Hair loss   Diltiazem Other (See Comments)    Hair loss   Hydralazine Other (See Comments)   Penicillins Rash    Has patient had a PCN reaction causing immediate rash, facial/tongue/throat swelling, SOB or lightheadedness with hypotension: No Has patient had  a PCN reaction causing severe rash involving mucus membranes or skin necrosis: No Has patient had a PCN reaction that required hospitalization No Has patient had a PCN reaction occurring within the last 10 years: No If all of the above answers are "NO", then may proceed with Cephalosporin use.      Past  Medical History:  Diagnosis Date   Anemia    Arthritis    Autoimmune hepatitis ([place])    DDD (degenerative disc disease), lumbar    GERD (gastroesophageal reflux disease)    Gout    Hypertension    Immune deficiency disorder ([place])    Internal hemorrhoids    Leukopenia    Venous stasis      Past Surgical History:  Procedure Laterality Date   ABDOMINAL HYSTERECTOMY     APPENDECTOMY     BREAST BIOPSY Left 1980   neg   BREAST SURGERY     LEFT OOPHORECTOMY     MAXIMUM ACCESS (MAS)POSTERIOR LUMBAR INTERBODY FUSION (PLIF) 1 LEVEL N/A 10/27/2015   Procedure: FOR MAXIMUM ACCESS (MAS) POSTERIOR LUMBAR INTERBODY FUSION (PLIF) L5-S1 MAS with resection of synovial cyst;  Surgeon: Erline Levine, MD;  Location: [facility];  Service: Neurosurgery;  Laterality: N/A;   NECK SURGERY     ROTATOR CUFF REPAIR      Social History   Socioeconomic History   Marital status: Married    Spouse name: Not on file   Number of children: Not on file   Years of education: Not on file   Highest education level: Not on file  Occupational History   Not on file  Tobacco Use   Smoking status: Never   Smokeless tobacco: Never  Vaping Use   Vaping Use: Never used  Substance and Sexual Activity   Alcohol use: No   Drug use: Never   Sexual activity: Yes  Other Topics Concern   Not on file  Social History Narrative   Not on file   Social Determinants of Health   Financial Resource Strain: Not on file  Food Insecurity: Not on file  Transportation Needs: Not on file  Physical Activity: Not on file  Stress: Not on file  Social Connections: Not on file  Intimate Partner Violence: Not on file    Family History  Problem Relation Age of Onset   Breast cancer Paternal Aunt    Stroke Mother    Aneurysm Father    Stroke Paternal Grandfather    Diabetes Neg Hx      Current Outpatient Medications:    aspirin EC 81 MG tablet, Take 81 mg by mouth daily., Disp: , Rfl:    azaTHIOprine (IMURAN) 50 MG  tablet, Take 50 mg by mouth daily., Disp: , Rfl:    Calcium-Magnesium-Vitamin D (CALCIUM 1200+D3 PO), Take 1 tablet by mouth in the morning and at bedtime., Disp: , Rfl:    carvedilol (COREG) 25 MG tablet, Take 25 mg by mouth 2 (two) times daily with a meal., Disp: , Rfl:    isosorbide mononitrate (IMDUR) 120 MG 24 hr tablet, Take 120 mg by mouth daily., Disp: , Rfl:    predniSONE (DELTASONE) 5 MG tablet, Take 5 mg by mouth daily., Disp: , Rfl:    vitamin B-12 (CYANOCOBALAMIN) 500 MCG tablet, Take 500 mcg by mouth daily., Disp: , Rfl:   Physical exam:  Vitals:   12/17/21 1341  BP: (!) 159/86  Pulse: 63  Resp: 16  Temp: 98.2 F (36.8 C)  TempSrc: Oral  Weight: 147 lb 8 oz (66.9 kg)   Physical Exam Constitutional:      General: She is not in acute distress. Cardiovascular:     Rate and Rhythm: Normal rate and regular rhythm.     Heart sounds: Normal heart sounds.  Pulmonary:     Effort: Pulmonary effort is normal.     Breath sounds: Normal breath sounds.  Skin:    General: Skin is warm and dry.  Neurological:     Mental Status: She is alert and oriented to person, place, and time.     CMP Latest Ref Rng & Units 10/19/2015  Glucose 65 - 99 mg/dL 96  BUN 6 - 20 mg/dL 13  Creatinine 0.44 - 1.00 mg/dL 1.08(H)  Sodium 135 - 145 mmol/L 137  Potassium 3.5 - 5.1 mmol/L 3.2(L)  Chloride 101 - 111 mmol/L 100(L)  CO2 22 - 32 mmol/L 28  Calcium 8.9 - 10.3 mg/dL 9.7  Total Protein 6.5 - 8.1 g/dL -  Total Bilirubin 0.3 - 1.2 mg/dL -  Alkaline Phos 38 - 126 U/L -  AST 15 - 41 U/L -  ALT 14 - 54 U/L -   CBC Latest Ref Rng & Units 12/14/2021  WBC 4.0 - 10.5 K/uL 4.7  Hemoglobin 12.0 - 15.0 g/dL 10.9(L)  Hematocrit 36.0 - 46.0 % 33.4(L)  Platelets 150 - 400 K/uL 221     Assessment and plan- Patient is a 81 y.o. female who is here for follow-up of following issues:  Normocytic anemia: Patient's baseline hemoglobin is around 11.  It has drifted down mildly to 10.9.  Labs are  suggestive of iron deficiency.  She is agreeable to proceed with Venofer 300 mg x 3 doses which she has also received in the past and tolerated well.  We will schedule her over the next 2 weeks.  Repeat CBC ferritin and iron studies in 3 in 6 months and I will see her back in 6 months IgG MGUS: Myeloma panel from today is pending.  Continue to monitor Neutropenia: Mild and waxes and wanes.  Continue to monitor   Visit Diagnosis 1. Iron deficiency anemia, unspecified iron deficiency anemia type      Dr. Randa Evens, MD, MPH [facility] [phone] 12/17/2021 4:34 PM

## 2021-12-20 ENCOUNTER — Other Ambulatory Visit: Payer: Self-pay

## 2021-12-20 ENCOUNTER — Inpatient Hospital Stay: Payer: Medicare HMO

## 2021-12-20 VITALS — BP 180/90 | HR 56 | Temp 97.3°F | Resp 19

## 2021-12-20 DIAGNOSIS — D649 Anemia, unspecified: Secondary | ICD-10-CM | POA: Diagnosis not present

## 2021-12-20 DIAGNOSIS — D509 Iron deficiency anemia, unspecified: Secondary | ICD-10-CM

## 2021-12-20 MED ORDER — SODIUM CHLORIDE 0.9 % IV SOLN
300.0000 mg | INTRAVENOUS | Status: DC
  Administered 2021-12-20: 300 mg via INTRAVENOUS
  Filled 2021-12-20: qty 300

## 2021-12-20 MED ORDER — SODIUM CHLORIDE 0.9 % IV SOLN
Freq: Once | INTRAVENOUS | Status: AC
  Filled 2021-12-20: qty 250

## 2021-12-20 NOTE — Patient Instructions (Signed)

## 2021-12-20 NOTE — Progress Notes (Signed)
Post Venofer infusion pt.'s BP 180/90 HR-54. Per pt, "I feel fine and I have an appointment on Tuesday 12/25/21 with my primary doctor to address my blood pressure and blood pressure medications." Pt is asymptomatic and has no complaints at this time. RN educated pt on the importance of notifying the clinic if any complications occurs at home and when to seek emergency care. Pt verbalized understanding and all questions answered at this time. RN educated pt on the importance of checking her blood pressure at home and to write them down for her PCP prior to her appointment. Pt verbalized understanding. Pt stable for discharge and all questions answered. Pt discharged home with no complications.   Brittney Clark [employer]

## 2021-12-21 LAB — MULTIPLE MYELOMA PANEL, SERUM
Albumin SerPl Elph-Mcnc: 3.4 g/dL (ref 2.9–4.4)
Albumin/Glob SerPl: 1 (ref 0.7–1.7)
Alpha 1: 0.3 g/dL (ref 0.0–0.4)
Alpha2 Glob SerPl Elph-Mcnc: 0.6 g/dL (ref 0.4–1.0)
B-Globulin SerPl Elph-Mcnc: 1 g/dL (ref 0.7–1.3)
Gamma Glob SerPl Elph-Mcnc: 1.6 g/dL (ref 0.4–1.8)
Globulin, Total: 3.5 g/dL (ref 2.2–3.9)
IgA: 296 mg/dL (ref 64–422)
IgG (Immunoglobin G), Serum: 1817 mg/dL — ABNORMAL HIGH (ref 586–1602)
IgM (Immunoglobulin M), Srm: 42 mg/dL (ref 26–217)
M Protein SerPl Elph-Mcnc: 0.4 g/dL — ABNORMAL HIGH
Total Protein ELP: 6.9 g/dL (ref 6.0–8.5)

## 2021-12-24 ENCOUNTER — Inpatient Hospital Stay: Payer: Medicare HMO

## 2021-12-24 ENCOUNTER — Other Ambulatory Visit: Payer: Self-pay

## 2021-12-24 VITALS — BP 142/82 | HR 54 | Temp 96.4°F | Resp 18

## 2021-12-24 DIAGNOSIS — D649 Anemia, unspecified: Secondary | ICD-10-CM | POA: Diagnosis not present

## 2021-12-24 DIAGNOSIS — D509 Iron deficiency anemia, unspecified: Secondary | ICD-10-CM

## 2021-12-24 MED ORDER — SODIUM CHLORIDE 0.9 % IV SOLN
Freq: Once | INTRAVENOUS | Status: AC
  Filled 2021-12-24: qty 250

## 2021-12-24 MED ORDER — SODIUM CHLORIDE 0.9 % IV SOLN
300.0000 mg | INTRAVENOUS | Status: DC
  Administered 2021-12-24: 300 mg via INTRAVENOUS
  Filled 2021-12-24: qty 15

## 2021-12-24 NOTE — Patient Instructions (Signed)

## 2021-12-27 ENCOUNTER — Other Ambulatory Visit: Payer: Self-pay | Admitting: Physical Medicine & Rehabilitation

## 2021-12-27 DIAGNOSIS — M5441 Lumbago with sciatica, right side: Secondary | ICD-10-CM

## 2021-12-27 DIAGNOSIS — G8929 Other chronic pain: Secondary | ICD-10-CM

## 2021-12-28 ENCOUNTER — Inpatient Hospital Stay: Payer: Medicare HMO

## 2021-12-28 ENCOUNTER — Other Ambulatory Visit: Payer: Self-pay

## 2021-12-28 VITALS — BP 127/70 | HR 55 | Temp 97.8°F | Resp 16

## 2021-12-28 DIAGNOSIS — D649 Anemia, unspecified: Secondary | ICD-10-CM | POA: Diagnosis not present

## 2021-12-28 DIAGNOSIS — D509 Iron deficiency anemia, unspecified: Secondary | ICD-10-CM

## 2021-12-28 MED ORDER — SODIUM CHLORIDE 0.9 % IV SOLN
300.0000 mg | INTRAVENOUS | Status: DC
  Administered 2021-12-28: 300 mg via INTRAVENOUS
  Filled 2021-12-28: qty 300

## 2021-12-28 MED ORDER — SODIUM CHLORIDE 0.9 % IV SOLN
Freq: Once | INTRAVENOUS | Status: AC
  Filled 2021-12-28: qty 250

## 2021-12-28 NOTE — Patient Instructions (Signed)

## 2022-01-09 ENCOUNTER — Ambulatory Visit
Admission: RE | Admit: 2022-01-09 | Discharge: 2022-01-09 | Disposition: A | Discharge status: Home / Self Care | Payer: Medicare HMO | Source: Ambulatory Visit | Attending: Physical Medicine & Rehabilitation | Admitting: Physical Medicine & Rehabilitation

## 2022-01-09 ENCOUNTER — Other Ambulatory Visit: Payer: Self-pay

## 2022-01-09 DIAGNOSIS — M5441 Lumbago with sciatica, right side: Secondary | ICD-10-CM | POA: Diagnosis not present

## 2022-01-09 DIAGNOSIS — G8929 Other chronic pain: Secondary | ICD-10-CM | POA: Diagnosis present

## 2022-01-16 ENCOUNTER — Other Ambulatory Visit: Payer: Self-pay | Admitting: Gastroenterology

## 2022-01-16 DIAGNOSIS — K754 Autoimmune hepatitis: Secondary | ICD-10-CM

## 2022-01-23 ENCOUNTER — Ambulatory Visit
Admission: RE | Admit: 2022-01-23 | Discharge: 2022-01-23 | Disposition: A | Discharge status: Home / Self Care | Payer: Medicare HMO | Source: Ambulatory Visit | Attending: Gastroenterology | Admitting: Gastroenterology

## 2022-01-23 DIAGNOSIS — K754 Autoimmune hepatitis: Secondary | ICD-10-CM | POA: Insufficient documentation

## 2022-03-15 ENCOUNTER — Ambulatory Visit: Payer: Medicare HMO | Admitting: Oncology

## 2022-03-15 ENCOUNTER — Other Ambulatory Visit: Payer: Medicare HMO

## 2022-03-18 ENCOUNTER — Inpatient Hospital Stay: Payer: Medicare HMO | Attending: Oncology

## 2022-03-18 DIAGNOSIS — D473 Essential (hemorrhagic) thrombocythemia: Secondary | ICD-10-CM | POA: Diagnosis not present

## 2022-03-18 DIAGNOSIS — D509 Iron deficiency anemia, unspecified: Secondary | ICD-10-CM | POA: Diagnosis present

## 2022-03-18 DIAGNOSIS — D709 Neutropenia, unspecified: Secondary | ICD-10-CM | POA: Diagnosis present

## 2022-03-18 LAB — IRON AND TIBC
Iron: 80 ug/dL (ref 28–170)
Saturation Ratios: 27 % (ref 10.4–31.8)
TIBC: 293 ug/dL (ref 250–450)
UIBC: 213 ug/dL

## 2022-03-18 LAB — CBC WITH DIFFERENTIAL/PLATELET
Abs Immature Granulocytes: 0.03 10*3/uL (ref 0.00–0.07)
Basophils Absolute: 0 10*3/uL (ref 0.0–0.1)
Basophils Relative: 1 %
Eosinophils Absolute: 0 10*3/uL (ref 0.0–0.5)
Eosinophils Relative: 0 %
HCT: 36 % (ref 36.0–46.0)
Hemoglobin: 11.8 g/dL — ABNORMAL LOW (ref 12.0–15.0)
Immature Granulocytes: 1 %
Lymphocytes Relative: 17 %
Lymphs Abs: 1 10*3/uL (ref 0.7–4.0)
MCH: 30.8 pg (ref 26.0–34.0)
MCHC: 32.8 g/dL (ref 30.0–36.0)
MCV: 94 fL (ref 80.0–100.0)
Monocytes Absolute: 0.7 10*3/uL (ref 0.1–1.0)
Monocytes Relative: 13 %
Neutro Abs: 3.8 10*3/uL (ref 1.7–7.7)
Neutrophils Relative %: 68 %
Platelets: 189 10*3/uL (ref 150–400)
RBC: 3.83 MIL/uL — ABNORMAL LOW (ref 3.87–5.11)
RDW: 16 % — ABNORMAL HIGH (ref 11.5–15.5)
WBC: 5.5 10*3/uL (ref 4.0–10.5)
nRBC: 0 % (ref 0.0–0.2)

## 2022-03-18 LAB — FERRITIN: Ferritin: 164 ng/mL (ref 11–307)

## 2022-04-01 ENCOUNTER — Other Ambulatory Visit: Payer: Self-pay | Admitting: Neurosurgery

## 2022-04-19 NOTE — Pre-Procedure Instructions (Signed)
Surgical Instructions ? ? ? Your procedure is scheduled on Wednesday, May 24. ? Report to [facility] Main Entrance "A" at 11:00 A.M., then check in with the Admitting office. ? Call this number if you have problems the morning of surgery: ? [phone] ? ? If you have any questions prior to your surgery date call [phone]: Open Monday-Friday 8am-4pm ? ? ? Remember: ? Do not eat or drink after midnight the night before your surgery ? ? Take these medicines the morning of surgery with A SIP OF WATER:  ? ?carvedilol (COREG) ?DILT-XR  ?isosorbide mononitrate (IMDUR)  ?omeprazole (PRILOSEC)  ?predniSONE (DELTASONE ? ? ?Follow your surgeon's instructions on when to stop Aspirin.  If no instructions were given by your surgeon then you will need to call the office to get those instructions.   ? ?As of today, STOP taking any Aleve, Naproxen, Ibuprofen, Motrin, Advil, Goody's, BC's, all herbal medications, fish oil, and all vitamins. ? ?         ?Do not wear jewelry or makeup ?Do not wear lotions, powders, perfumes/colognes, or deodorant. ?Do not shave 48 hours prior to surgery.  Men may shave face and neck. ?Do not bring valuables to the hospital. ?Do not wear nail polish, gel polish, artificial nails, or any other type of covering on natural nails (fingers and toes) ?If you have artificial nails or gel coating that need to be removed by a nail salon, please have this removed prior to surgery. Artificial nails or gel coating may interfere with anesthesia's ability to adequately monitor your vital signs. ? ?[facility] is not responsible for any belongings or valuables. .  ? ?Do NOT Smoke (Tobacco/Vaping)  24 hours prior to your procedure ? ?If you use a CPAP at night, you may bring your mask for your overnight stay. ?  ?Contacts, glasses, hearing aids, dentures or partials may not be worn into surgery, please bring cases for these belongings ?  ?For patients admitted to the hospital, discharge time will be determined by  your treatment team. ?  ?Patients discharged the day of surgery will not be allowed to drive home, and someone needs to stay with them for 24 hours. ? ? ?SURGICAL WAITING ROOM VISITATION ?Patients having surgery or a procedure in a hospital may have two support people. ?Children under the age of 69 must have an adult with them who is not the patient. ?They may stay in the waiting area during the procedure and may switch out with other visitors. If the patient needs to stay at the hospital during part of their recovery, the visitor guidelines for inpatient rooms apply. ? ?Please refer to the [facility] website for the visitor guidelines for Inpatients (after your surgery is over and you are in a regular room).  ? ? ? ? ? ?Special instructions:   ? ?Oral Hygiene is also important to reduce your risk of infection.  Remember - BRUSH YOUR TEETH THE MORNING OF SURGERY WITH YOUR REGULAR TOOTHPASTE ? ? ?[facility]- Preparing For Surgery ? ?Before surgery, you can play an important role. Because skin is not sterile, your skin needs to be as free of germs as possible. You can reduce the number of germs on your skin by washing with CHG (chlorahexidine gluconate) Soap before surgery.  CHG is an antiseptic cleaner which kills germs and bonds with the skin to continue killing germs even after washing.   ? ? ?Please do not use if you have an allergy to CHG  or antibacterial soaps. If your skin becomes reddened/irritated stop using the CHG.  ?Do not shave (including legs and underarms) for at least 48 hours prior to first CHG shower. It is OK to shave your face. ? ?Please follow these instructions carefully. ?  ? ? Shower the NIGHT BEFORE SURGERY and the MORNING OF SURGERY with CHG Soap.  ? If you chose to wash your hair, wash your hair first as usual with your normal shampoo. After you shampoo, rinse your hair and body thoroughly to remove the shampoo.  Then [employer] and genitals (private parts) with your normal soap and rinse  thoroughly to remove soap. ? ?After that Use CHG Soap as you would any other liquid soap. You can apply CHG directly to the skin and wash gently with a scrungie or a clean washcloth.  ? ?Apply the CHG Soap to your body ONLY FROM THE NECK DOWN.  Do not use on open wounds or open sores. Avoid contact with your eyes, ears, mouth and genitals (private parts). Wash Face and genitals (private parts)  with your normal soap.  ? ?Wash thoroughly, paying special attention to the area where your surgery will be performed. ? ?Thoroughly rinse your body with warm water from the neck down. ? ?DO NOT shower/wash with your normal soap after using and rinsing off the CHG Soap. ? ?Pat yourself dry with a CLEAN TOWEL. ? ?Wear CLEAN PAJAMAS to bed the night before surgery ? ?Place CLEAN SHEETS on your bed the night before your surgery ? ?DO NOT SLEEP WITH PETS. ? ? ?Day of Surgery: ? ?Take a shower with CHG soap. ?Wear Clean/Comfortable clothing the morning of surgery ?Do not apply any deodorants/lotions.   ?Remember to brush your teeth WITH YOUR REGULAR TOOTHPASTE. ? ? ? ?If you received a COVID test during your pre-op visit, it is requested that you wear a mask when out in public, stay away from anyone that may not be feeling well, and notify your surgeon if you develop symptoms. If you have been in contact with anyone that has tested positive in the last 10 days, please notify your surgeon. ? ?  ?Please read over the following fact sheets that you were given.  ? ?

## 2022-04-22 ENCOUNTER — Other Ambulatory Visit: Payer: Self-pay

## 2022-04-22 ENCOUNTER — Encounter (HOSPITAL_COMMUNITY)
Admission: RE | Admit: 2022-04-22 | Discharge: 2022-04-22 | Disposition: A | Discharge status: Home / Self Care | Payer: Medicare HMO | Source: Ambulatory Visit | Attending: Neurosurgery | Admitting: Neurosurgery

## 2022-04-22 ENCOUNTER — Encounter (HOSPITAL_COMMUNITY): Payer: Self-pay

## 2022-04-22 VITALS — BP 125/71 | HR 59 | Temp 98.1°F | Resp 17 | Ht 63.0 in | Wt 153.5 lb

## 2022-04-22 DIAGNOSIS — Z01818 Encounter for other preprocedural examination: Secondary | ICD-10-CM

## 2022-04-22 DIAGNOSIS — Z01812 Encounter for preprocedural laboratory examination: Secondary | ICD-10-CM | POA: Diagnosis not present

## 2022-04-22 DIAGNOSIS — D509 Iron deficiency anemia, unspecified: Secondary | ICD-10-CM

## 2022-04-22 DIAGNOSIS — I10 Essential (primary) hypertension: Secondary | ICD-10-CM

## 2022-04-22 LAB — CBC
HCT: 37.5 % (ref 36.0–46.0)
Hemoglobin: 12.6 g/dL (ref 12.0–15.0)
MCH: 32.1 pg (ref 26.0–34.0)
MCHC: 33.6 g/dL (ref 30.0–36.0)
MCV: 95.4 fL (ref 80.0–100.0)
Platelets: 244 10*3/uL (ref 150–400)
RBC: 3.93 MIL/uL (ref 3.87–5.11)
RDW: 15.1 % (ref 11.5–15.5)
WBC: 7.2 10*3/uL (ref 4.0–10.5)
nRBC: 0 % (ref 0.0–0.2)

## 2022-04-22 LAB — BASIC METABOLIC PANEL
Anion gap: 7 (ref 5–15)
BUN: 18 mg/dL (ref 8–23)
CO2: 28 mmol/L (ref 22–32)
Calcium: 10.1 mg/dL (ref 8.9–10.3)
Chloride: 104 mmol/L (ref 98–111)
Creatinine, Ser: 1.14 mg/dL — ABNORMAL HIGH (ref 0.44–1.00)
GFR, Estimated: 49 mL/min — ABNORMAL LOW (ref >60)
Glucose, Bld: 92 mg/dL (ref 70–99)
Potassium: 3.9 mmol/L (ref 3.5–5.1)
Sodium: 139 mmol/L (ref 135–145)

## 2022-04-22 LAB — TYPE AND SCREEN
ABO/RH(D): O POS
Antibody Screen: NEGATIVE

## 2022-04-22 LAB — SURGICAL PCR SCREEN
MRSA, PCR: NEGATIVE
Staphylococcus aureus: NEGATIVE

## 2022-04-22 NOTE — Progress Notes (Signed)
PCP - Dr. Ramonita Lab III ?Cardiologist - denies ? ?PPM/ICD - denies ? ? ?Chest x-ray - 05/01/15 ?EKG - 09/17/21- records requested ?Stress Test - 09/26/21- CE ?ECHO - 08/29/21- CE ?Cardiac Cath - Around 15 yrs ago per pt, no abnormalities ? ?Sleep Study - denies ? ? ?DM- denies ? ?Blood Thinner Instructions: n/a ?Aspirin Instructions: hold 5 days per order ? ? ?ERAS Protcol - no, NPO ? ? ?COVID TEST- n/a ? ? ?Anesthesia review: no ? ?Patient denies shortness of breath, fever, cough and chest pain at PAT appointment ? ? ?All instructions explained to the patient, with a verbal understanding of the material. Patient agrees to go over the instructions while at home for a better understanding. Patient also instructed to notify surgeon of any contact with COVID+ person or if she develops any symptoms. The opportunity to ask questions was provided. ?  ?

## 2022-04-25 ENCOUNTER — Other Ambulatory Visit: Payer: Self-pay | Admitting: Student

## 2022-04-25 ENCOUNTER — Ambulatory Visit
Admission: RE | Admit: 2022-04-25 | Discharge: 2022-04-25 | Disposition: A | Discharge status: Home / Self Care | Payer: Medicare HMO | Source: Ambulatory Visit | Attending: Student | Admitting: Student

## 2022-04-25 DIAGNOSIS — M5417 Radiculopathy, lumbosacral region: Secondary | ICD-10-CM | POA: Insufficient documentation

## 2022-04-25 NOTE — Progress Notes (Signed)
Anesthesia Chart Review:  Patient evaluated by cardiology at [facility] on 09/17/2021 for recent type II MI/demand ischemia in setting of acute GI bleed for which she was admitted in September 2022.  Per cardiology notes, T wave inversions and troponin leak were felt secondary to hypovolemia and blood loss.  However, it was felt reasonable to evaluate for any source of obstructive CAD.  Exercise stress test 09/26/2021 showed abnormal ECG changes, however nuclear study showed no evidence of ischemia or scar, EF >60%, no significant coronary calcifications noted on attenuation CT.  Per follow-up telephone encounter by cardiology 10/02/2021, "Discussed stress test results with patient. We discussed that the ECG changes are abnormal, but can be non-specific. She denies any chest pain or functional limitations. We discussed possibility for coronary angiogram, but given lack of angina/equivalent or exercise intolerance, using shared decision making, we opted to continue monitoring medically and reassess if symptoms were to develop. She is agreeable to the plan."  Patient seen by PCP Dr. Ramonita Lab 04/02/2022 and cleared for surgery.  Per note, "Hypertension. Heart rate and blood pressure remain well controlled. No new cardiac symptoms. She is not have significant coronary disease, heart failure history, dysrhythmia, or edema. She has good functional capacity. EKG today revealing sinus bradycardia with nonspecific changes, but without evidence of ischemia. Limit sodium. Follow metabolic panel, urinalysis, creatinine. She appears to be medically stable to proceed with surgical intervention if required. It should be adequate safe for her to stop aspirin 5 days ahead of procedure, resuming when felt safe to do so from a surgical standpoint. If possible, would continue her prednisone, and may ultimately need increased prednisone dose after the procedure for stress dosing."  Follows with gastroenterology at [facility] for history of  autoimmune hepatitis.  Last seen 01/16/2022.  Per note, "1. 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 2. Follow up 50msooner if problems."   Preop labs reviewed, unremarkable.  EKG 04/02/2022 (copy on chart).  Sinus bradycardia.  Rate 53.  Nonspecific T wave changes.  Nuclear stress 09/26/2021 (Care Everywhere): - Normal myocardial perfusion study  - No evidence of any significant ischemia or scar  - Left ventricular systolic function is normal. Post stress the ejection  fraction is > 60%.  - No significant coronary calcifications were noted on the attenuation CT   TTE 08/29/2021 (Care Everywhere): Summary    1. Concentric left ventricular remodeling with hyperdynamic systolic  function (EF >>81%.    2. Normal right ventricular size and systolic function.    3. Aortic sclerosis without stenosis or regurgitation.    4. Left atrial enlargement.     JWynonia MustyMHca Houston Healthcare Main[facility] CenterShort Stay Center/Anesthesiology Phone ([phone]/18/2023 1:47 PM

## 2022-04-30 NOTE — Progress Notes (Signed)
LVM with surgery date and time change. Surgery Fri 05/03/22 [id number], arrival 0910. Advised to follow previous instructions given at the pre-op appointment.

## 2022-05-03 ENCOUNTER — Encounter (HOSPITAL_COMMUNITY): Payer: Self-pay | Admitting: Neurosurgery

## 2022-05-03 ENCOUNTER — Encounter (HOSPITAL_COMMUNITY)
Admission: RE | Disposition: A | Discharge status: Home / Self Care | Payer: Self-pay | Source: Home / Self Care | Attending: Neurosurgery

## 2022-05-03 ENCOUNTER — Ambulatory Visit (HOSPITAL_BASED_OUTPATIENT_CLINIC_OR_DEPARTMENT_OTHER): Payer: Medicare HMO | Admitting: Anesthesiology

## 2022-05-03 ENCOUNTER — Ambulatory Visit (HOSPITAL_COMMUNITY)
Admission: RE | Admit: 2022-05-03 | Discharge: 2022-05-04 | Disposition: A | Discharge status: Home / Self Care | Payer: Medicare HMO | Attending: Neurosurgery | Admitting: Neurosurgery

## 2022-05-03 ENCOUNTER — Ambulatory Visit (HOSPITAL_COMMUNITY): Payer: Medicare HMO | Admitting: Vascular Surgery

## 2022-05-03 ENCOUNTER — Other Ambulatory Visit: Payer: Self-pay

## 2022-05-03 ENCOUNTER — Ambulatory Visit (HOSPITAL_COMMUNITY): Payer: Medicare HMO

## 2022-05-03 DIAGNOSIS — M48062 Spinal stenosis, lumbar region with neurogenic claudication: Secondary | ICD-10-CM | POA: Insufficient documentation

## 2022-05-03 DIAGNOSIS — Z981 Arthrodesis status: Secondary | ICD-10-CM | POA: Diagnosis not present

## 2022-05-03 DIAGNOSIS — I10 Essential (primary) hypertension: Secondary | ICD-10-CM | POA: Insufficient documentation

## 2022-05-03 DIAGNOSIS — R2681 Unsteadiness on feet: Secondary | ICD-10-CM | POA: Insufficient documentation

## 2022-05-03 DIAGNOSIS — M5116 Intervertebral disc disorders with radiculopathy, lumbar region: Secondary | ICD-10-CM

## 2022-05-03 DIAGNOSIS — Z862 Personal history of diseases of the blood and blood-forming organs and certain disorders involving the immune mechanism: Secondary | ICD-10-CM | POA: Diagnosis not present

## 2022-05-03 DIAGNOSIS — M48061 Spinal stenosis, lumbar region without neurogenic claudication: Secondary | ICD-10-CM | POA: Diagnosis present

## 2022-05-03 DIAGNOSIS — M6281 Muscle weakness (generalized): Secondary | ICD-10-CM | POA: Insufficient documentation

## 2022-05-03 DIAGNOSIS — M5136 Other intervertebral disc degeneration, lumbar region: Secondary | ICD-10-CM | POA: Insufficient documentation

## 2022-05-03 DIAGNOSIS — Z8619 Personal history of other infectious and parasitic diseases: Secondary | ICD-10-CM | POA: Diagnosis not present

## 2022-05-03 DIAGNOSIS — M4316 Spondylolisthesis, lumbar region: Secondary | ICD-10-CM | POA: Insufficient documentation

## 2022-05-03 SURGERY — POSTERIOR LUMBAR FUSION 1 LEVEL
Anesthesia: General | Site: Back

## 2022-05-03 MED ORDER — ACETAMINOPHEN 10 MG/ML IV SOLN: 1000.0000 mg | Freq: Once | INTRAVENOUS | Status: DC | PRN

## 2022-05-03 MED ORDER — PREDNISONE 5 MG PO TABS
5.0000 mg | ORAL_TABLET | Freq: Every day | ORAL | Status: DC
  Administered 2022-05-03 – 2022-05-04 (×2): 5 mg via ORAL
  Filled 2022-05-03 (×2): qty 1

## 2022-05-03 MED ORDER — PROPOFOL 10 MG/ML IV BOLUS
INTRAVENOUS | Status: AC
  Filled 2022-05-03: qty 20

## 2022-05-03 MED ORDER — BUPIVACAINE LIPOSOME 1.3 % IJ SUSP
INTRAMUSCULAR | Status: DC | PRN
  Administered 2022-05-03: 20 mL

## 2022-05-03 MED ORDER — EPHEDRINE SULFATE-NACL 50-0.9 MG/10ML-% IV SOSY
PREFILLED_SYRINGE | INTRAVENOUS | Status: DC | PRN
  Administered 2022-05-03: 5 mg via INTRAVENOUS

## 2022-05-03 MED ORDER — ACETAMINOPHEN 500 MG PO TABS: 1000.0000 mg | ORAL_TABLET | Freq: Once | ORAL | Status: DC | PRN

## 2022-05-03 MED ORDER — 0.9 % SODIUM CHLORIDE (POUR BTL) OPTIME
TOPICAL | Status: DC | PRN
  Administered 2022-05-03 (×2): 1000 mL

## 2022-05-03 MED ORDER — VANCOMYCIN HCL IN DEXTROSE 1-5 GM/200ML-% IV SOLN
1000.0000 mg | INTRAVENOUS | Status: AC
  Administered 2022-05-03: 1000 mg via INTRAVENOUS
  Filled 2022-05-03: qty 200

## 2022-05-03 MED ORDER — FENTANYL CITRATE (PF) 100 MCG/2ML IJ SOLN
25.0000 ug | INTRAMUSCULAR | Status: DC | PRN
  Administered 2022-05-03 (×2): 25 ug via INTRAVENOUS

## 2022-05-03 MED ORDER — ONDANSETRON HCL 4 MG PO TABS: 4.0000 mg | ORAL_TABLET | Freq: Four times a day (QID) | ORAL | Status: DC | PRN

## 2022-05-03 MED ORDER — AZATHIOPRINE 50 MG PO TABS
50.0000 mg | ORAL_TABLET | Freq: Every day | ORAL | Status: DC
  Administered 2022-05-04: 50 mg via ORAL
  Filled 2022-05-03: qty 1

## 2022-05-03 MED ORDER — PANTOPRAZOLE SODIUM 40 MG PO TBEC
40.0000 mg | DELAYED_RELEASE_TABLET | Freq: Every day | ORAL | Status: DC
  Administered 2022-05-04: 40 mg via ORAL
  Filled 2022-05-03: qty 1

## 2022-05-03 MED ORDER — PANTOPRAZOLE SODIUM 40 MG IV SOLR: 40.0000 mg | Freq: Every day | INTRAVENOUS | Status: DC

## 2022-05-03 MED ORDER — THROMBIN 20000 UNITS EX SOLR
CUTANEOUS | Status: AC
  Filled 2022-05-03: qty 20000

## 2022-05-03 MED ORDER — DEXAMETHASONE SODIUM PHOSPHATE 10 MG/ML IJ SOLN
INTRAMUSCULAR | Status: DC | PRN
  Administered 2022-05-03: 10 mg via INTRAVENOUS

## 2022-05-03 MED ORDER — CHLORHEXIDINE GLUCONATE CLOTH 2 % EX PADS
6.0000 | MEDICATED_PAD | Freq: Once | CUTANEOUS | Status: DC
Start: 2022-05-03 — End: 2022-05-03

## 2022-05-03 MED ORDER — HYDROCODONE-ACETAMINOPHEN 5-325 MG PO TABS
2.0000 | ORAL_TABLET | ORAL | Status: DC | PRN
  Administered 2022-05-03 – 2022-05-04 (×4): 2 via ORAL
  Filled 2022-05-03 (×4): qty 2

## 2022-05-03 MED ORDER — LIDOCAINE 2% (20 MG/ML) 5 ML SYRINGE
INTRAMUSCULAR | Status: DC | PRN
  Administered 2022-05-03: 60 mg via INTRAVENOUS

## 2022-05-03 MED ORDER — CYCLOBENZAPRINE HCL 10 MG PO TABS
10.0000 mg | ORAL_TABLET | Freq: Three times a day (TID) | ORAL | Status: DC | PRN
  Administered 2022-05-03 – 2022-05-04 (×2): 10 mg via ORAL
  Filled 2022-05-03 (×2): qty 1

## 2022-05-03 MED ORDER — DEXAMETHASONE SODIUM PHOSPHATE 10 MG/ML IJ SOLN
INTRAMUSCULAR | Status: AC
  Filled 2022-05-03: qty 1

## 2022-05-03 MED ORDER — SODIUM CHLORIDE 0.9% FLUSH: 3.0000 mL | INTRAVENOUS | Status: DC | PRN

## 2022-05-03 MED ORDER — ASPIRIN 81 MG PO TBEC
81.0000 mg | DELAYED_RELEASE_TABLET | Freq: Every day | ORAL | Status: DC
  Administered 2022-05-04: 81 mg via ORAL
  Filled 2022-05-03: qty 1

## 2022-05-03 MED ORDER — SODIUM CHLORIDE 0.9% FLUSH: 3.0000 mL | Freq: Two times a day (BID) | INTRAVENOUS | Status: DC

## 2022-05-03 MED ORDER — ONDANSETRON HCL 4 MG/2ML IJ SOLN
INTRAMUSCULAR | Status: AC
  Filled 2022-05-03: qty 2

## 2022-05-03 MED ORDER — LIDOCAINE-EPINEPHRINE 1 %-1:100000 IJ SOLN
INTRAMUSCULAR | Status: DC | PRN
  Administered 2022-05-03: 10 mL

## 2022-05-03 MED ORDER — FENTANYL CITRATE (PF) 100 MCG/2ML IJ SOLN
INTRAMUSCULAR | Status: AC
  Filled 2022-05-03: qty 2

## 2022-05-03 MED ORDER — ONDANSETRON HCL 4 MG/2ML IJ SOLN
INTRAMUSCULAR | Status: DC | PRN
  Administered 2022-05-03: 4 mg via INTRAVENOUS

## 2022-05-03 MED ORDER — CEFAZOLIN SODIUM-DEXTROSE 2-4 GM/100ML-% IV SOLN
2.0000 g | Freq: Three times a day (TID) | INTRAVENOUS | Status: DC
  Filled 2022-05-03: qty 100

## 2022-05-03 MED ORDER — MENTHOL 3 MG MT LOZG: 1.0000 | LOZENGE | OROMUCOSAL | Status: DC | PRN

## 2022-05-03 MED ORDER — OXYCODONE HCL 5 MG/5ML PO SOLN: 5.0000 mg | Freq: Once | ORAL | Status: DC | PRN

## 2022-05-03 MED ORDER — ROCURONIUM BROMIDE 10 MG/ML (PF) SYRINGE
PREFILLED_SYRINGE | INTRAVENOUS | Status: DC | PRN
Start: 2022-05-03 — End: 2022-05-03
  Administered 2022-05-03: 60 mg via INTRAVENOUS
  Administered 2022-05-03: 20 mg via INTRAVENOUS

## 2022-05-03 MED ORDER — SODIUM CHLORIDE 0.9 % IV SOLN
250.0000 mL | INTRAVENOUS | Status: DC
  Administered 2022-05-03: 250 mL via INTRAVENOUS

## 2022-05-03 MED ORDER — LIDOCAINE-EPINEPHRINE 1 %-1:100000 IJ SOLN
INTRAMUSCULAR | Status: AC
  Filled 2022-05-03: qty 1

## 2022-05-03 MED ORDER — FENTANYL CITRATE (PF) 250 MCG/5ML IJ SOLN
INTRAMUSCULAR | Status: AC
  Filled 2022-05-03: qty 5

## 2022-05-03 MED ORDER — DILTIAZEM HCL ER COATED BEADS 240 MG PO CP24
240.0000 mg | ORAL_CAPSULE | Freq: Every day | ORAL | Status: DC
  Administered 2022-05-04: 240 mg via ORAL
  Filled 2022-05-03 (×2): qty 1

## 2022-05-03 MED ORDER — CHLORHEXIDINE GLUCONATE 0.12 % MT SOLN
15.0000 mL | Freq: Once | OROMUCOSAL | Status: AC
  Administered 2022-05-03: 15 mL via OROMUCOSAL
  Filled 2022-05-03: qty 15

## 2022-05-03 MED ORDER — LACTATED RINGERS IV SOLN
INTRAVENOUS | Status: DC
Start: 2022-05-03 — End: 2022-05-03

## 2022-05-03 MED ORDER — ALUM & MAG HYDROXIDE-SIMETH 200-200-20 MG/5ML PO SUSP: 30.0000 mL | Freq: Four times a day (QID) | ORAL | Status: DC | PRN

## 2022-05-03 MED ORDER — ROCURONIUM BROMIDE 10 MG/ML (PF) SYRINGE
PREFILLED_SYRINGE | INTRAVENOUS | Status: AC
  Filled 2022-05-03: qty 10

## 2022-05-03 MED ORDER — PHENYLEPHRINE HCL-NACL 20-0.9 MG/250ML-% IV SOLN
INTRAVENOUS | Status: DC | PRN
  Administered 2022-05-03: 25 ug/min via INTRAVENOUS

## 2022-05-03 MED ORDER — PHENOL 1.4 % MT LIQD: 1.0000 | OROMUCOSAL | Status: DC | PRN

## 2022-05-03 MED ORDER — PROPOFOL 10 MG/ML IV BOLUS
INTRAVENOUS | Status: DC | PRN
  Administered 2022-05-03: 130 mg via INTRAVENOUS

## 2022-05-03 MED ORDER — FENTANYL CITRATE (PF) 250 MCG/5ML IJ SOLN
INTRAMUSCULAR | Status: DC | PRN
Start: 2022-05-03 — End: 2022-05-03
  Administered 2022-05-03: 50 ug via INTRAVENOUS
  Administered 2022-05-03: 100 ug via INTRAVENOUS
  Administered 2022-05-03 (×2): 25 ug via INTRAVENOUS
  Administered 2022-05-03: 200 ug via INTRAVENOUS

## 2022-05-03 MED ORDER — EPHEDRINE 5 MG/ML INJ
INTRAVENOUS | Status: AC
  Filled 2022-05-03: qty 5

## 2022-05-03 MED ORDER — CARVEDILOL 25 MG PO TABS
25.0000 mg | ORAL_TABLET | Freq: Two times a day (BID) | ORAL | Status: DC
  Administered 2022-05-04: 25 mg via ORAL
  Filled 2022-05-03: qty 1

## 2022-05-03 MED ORDER — THROMBIN 20000 UNITS EX SOLR
CUTANEOUS | Status: DC | PRN
  Administered 2022-05-03: 20 mL via TOPICAL

## 2022-05-03 MED ORDER — BUPIVACAINE LIPOSOME 1.3 % IJ SUSP
INTRAMUSCULAR | Status: AC
  Filled 2022-05-03: qty 20

## 2022-05-03 MED ORDER — VANCOMYCIN HCL 750 MG/150ML IV SOLN
750.0000 mg | INTRAVENOUS | Status: DC
  Administered 2022-05-04: 750 mg via INTRAVENOUS
  Filled 2022-05-03: qty 150

## 2022-05-03 MED ORDER — LIDOCAINE 2% (20 MG/ML) 5 ML SYRINGE
INTRAMUSCULAR | Status: AC
  Filled 2022-05-03: qty 5

## 2022-05-03 MED ORDER — ONDANSETRON HCL 4 MG/2ML IJ SOLN: 4.0000 mg | Freq: Four times a day (QID) | INTRAMUSCULAR | Status: DC | PRN

## 2022-05-03 MED ORDER — ACETAMINOPHEN 325 MG PO TABS: 650.0000 mg | ORAL_TABLET | ORAL | Status: DC | PRN

## 2022-05-03 MED ORDER — ISOSORBIDE MONONITRATE ER 60 MG PO TB24
120.0000 mg | ORAL_TABLET | Freq: Every day | ORAL | Status: DC
  Administered 2022-05-04: 120 mg via ORAL
  Filled 2022-05-03: qty 2

## 2022-05-03 MED ORDER — ORAL CARE MOUTH RINSE: 15.0000 mL | Freq: Once | OROMUCOSAL | Status: AC

## 2022-05-03 MED ORDER — ACETAMINOPHEN 160 MG/5ML PO SOLN: 1000.0000 mg | Freq: Once | ORAL | Status: DC | PRN

## 2022-05-03 MED ORDER — OXYCODONE HCL 5 MG PO TABS: 5.0000 mg | ORAL_TABLET | Freq: Once | ORAL | Status: DC | PRN

## 2022-05-03 MED ORDER — HYDROMORPHONE HCL 1 MG/ML IJ SOLN: 0.5000 mg | INTRAMUSCULAR | Status: DC | PRN

## 2022-05-03 MED ORDER — ACETAMINOPHEN 650 MG RE SUPP: 650.0000 mg | RECTAL | Status: DC | PRN

## 2022-05-03 MED ORDER — CYANOCOBALAMIN 500 MCG PO TABS
500.0000 ug | ORAL_TABLET | Freq: Every day | ORAL | Status: DC
  Administered 2022-05-04: 500 ug via ORAL
  Filled 2022-05-03: qty 1

## 2022-05-03 SURGICAL SUPPLY — 83 items
ADH SKN CLS APL DERMABOND .7 (GAUZE/BANDAGES/DRESSINGS) ×1
APL SKNCLS STERI-STRIP NONHPOA (GAUZE/BANDAGES/DRESSINGS) ×1
BAG COUNTER SPONGE SURGICOUNT (BAG) ×3 IMPLANT
BAG SPNG CNTER NS LX DISP (BAG) ×1
BASKET BONE COLLECTION (BASKET) ×3 IMPLANT
BENZOIN TINCTURE PRP APPL 2/3 (GAUZE/BANDAGES/DRESSINGS) ×3 IMPLANT
BIT DRILL PLIF MAS 5.0MM DISP (DRILL) IMPLANT
BLADE CLIPPER SURG (BLADE) IMPLANT
BLADE SURG 11 STRL SS (BLADE) ×3 IMPLANT
BONE VIVIGEN FORMABLE 5.4CC (Bone Implant) ×2 IMPLANT
BUR CUTTER 7.0 ROUND (BURR) ×3 IMPLANT
BUR MATCHSTICK NEURO 3.0 LAGG (BURR) ×3 IMPLANT
CANISTER SUCT 3000ML PPV (MISCELLANEOUS) ×3 IMPLANT
CARTRIDGE OIL MAESTRO DRILL (MISCELLANEOUS) ×2 IMPLANT
CNTNR URN SCR LID CUP LEK RST (MISCELLANEOUS) ×2 IMPLANT
CONT SPEC 4OZ STRL OR WHT (MISCELLANEOUS) ×2
COVER BACK TABLE 60X90IN (DRAPES) ×3 IMPLANT
DECANTER SPIKE VIAL GLASS SM (MISCELLANEOUS) ×2 IMPLANT
DERMABOND ADVANCED (GAUZE/BANDAGES/DRESSINGS) ×1
DERMABOND ADVANCED .7 DNX12 (GAUZE/BANDAGES/DRESSINGS) ×2 IMPLANT
DIFFUSER DRILL AIR PNEUMATIC (MISCELLANEOUS) ×3 IMPLANT
DRAPE C-ARM 42X72 X-RAY (DRAPES) ×6 IMPLANT
DRAPE C-ARMOR (DRAPES) IMPLANT
DRAPE HALF SHEET 40X57 (DRAPES) IMPLANT
DRAPE LAPAROTOMY 100X72X124 (DRAPES) ×3 IMPLANT
DRAPE SURG 17X23 STRL (DRAPES) ×3 IMPLANT
DRILL PLIF MAS 5.0MM DISP (DRILL) ×2
DRSG OPSITE 4X5.5 SM (GAUZE/BANDAGES/DRESSINGS) ×3 IMPLANT
DRSG OPSITE POSTOP 4X6 (GAUZE/BANDAGES/DRESSINGS) ×3 IMPLANT
DURAPREP 26ML APPLICATOR (WOUND CARE) ×3 IMPLANT
ELECT REM PT RETURN 9FT ADLT (ELECTROSURGICAL) ×2
ELECTRODE REM PT RTRN 9FT ADLT (ELECTROSURGICAL) ×2 IMPLANT
EVACUATOR 1/8 PVC DRAIN (DRAIN) ×1 IMPLANT
EVACUATOR 3/16  PVC DRAIN (DRAIN)
EVACUATOR 3/16 PVC DRAIN (DRAIN) IMPLANT
GAUZE 4X4 16PLY ~~LOC~~+RFID DBL (SPONGE) IMPLANT
GAUZE SPONGE 4X4 12PLY STRL (GAUZE/BANDAGES/DRESSINGS) ×3 IMPLANT
GLOVE BIO SURGEON STRL SZ7 (GLOVE) ×3 IMPLANT
GLOVE BIO SURGEON STRL SZ8 (GLOVE) ×6 IMPLANT
GLOVE BIOGEL PI IND STRL 7.0 (GLOVE) IMPLANT
GLOVE BIOGEL PI INDICATOR 7.0 (GLOVE) ×3
GLOVE EXAM NITRILE XL STR (GLOVE) IMPLANT
GLOVE INDICATOR 8.5 STRL (GLOVE) ×6 IMPLANT
GLOVE SURG UNDER POLY LF SZ8.5 (GLOVE) ×6 IMPLANT
GOWN STRL REUS W/ TWL LRG LVL3 (GOWN DISPOSABLE) IMPLANT
GOWN STRL REUS W/ TWL XL LVL3 (GOWN DISPOSABLE) ×4 IMPLANT
GOWN STRL REUS W/TWL 2XL LVL3 (GOWN DISPOSABLE) IMPLANT
GOWN STRL REUS W/TWL LRG LVL3 (GOWN DISPOSABLE) ×10
GOWN STRL REUS W/TWL XL LVL3 (GOWN DISPOSABLE) ×4
GRAFT BNE MATRIX VG FRMBL MD 5 (Bone Implant) IMPLANT
GRAFT BONE PROTEIOS LRG 5CC (Orthopedic Implant) ×1 IMPLANT
KIT BASIN OR (CUSTOM PROCEDURE TRAY) ×3 IMPLANT
KIT GRAFTMAG DEL NEURO DISP (NEUROSURGERY SUPPLIES) IMPLANT
KIT TURNOVER KIT B (KITS) ×3 IMPLANT
MILL BONE PREP (MISCELLANEOUS) ×3 IMPLANT
NDL HYPO 25X1 1.5 SAFETY (NEEDLE) ×2 IMPLANT
NEEDLE HYPO 25X1 1.5 SAFETY (NEEDLE) ×2 IMPLANT
NS IRRIG 1000ML POUR BTL (IV SOLUTION) ×4 IMPLANT
OIL CARTRIDGE MAESTRO DRILL (MISCELLANEOUS) ×2
PACK LAMINECTOMY NEURO (CUSTOM PROCEDURE TRAY) ×3 IMPLANT
PAD ARMBOARD 7.5X6 YLW CONV (MISCELLANEOUS) ×9 IMPLANT
ROD 55MM (Rod) ×2 IMPLANT
ROD POST ARMADA 5.5X55 (Rod) ×1 IMPLANT
ROD SPNL 55XPREBNT NS MAS (Rod) IMPLANT
SCREW LOCK (Screw) ×12 IMPLANT
SCREW LOCK FXNS SPNE MAS PL (Screw) IMPLANT
SCREW SHANK 5.5X30MM (Screw) ×2 IMPLANT
SCREW TULIP 5.5 (Screw) ×2 IMPLANT
SLEEVE SURGEON STRL (DRAPES) ×1 IMPLANT
SPACER SUSTAIN RT 12 15D 9X26 (Spacer) ×2 IMPLANT
SPONGE SURGIFOAM ABS GEL 100 (HEMOSTASIS) ×3 IMPLANT
SPONGE T-LAP 4X18 ~~LOC~~+RFID (SPONGE) IMPLANT
STRIP CLOSURE SKIN 1/2X4 (GAUZE/BANDAGES/DRESSINGS) ×5 IMPLANT
SUT VIC AB 0 CT1 18XCR BRD8 (SUTURE) ×4 IMPLANT
SUT VIC AB 0 CT1 8-18 (SUTURE) ×2
SUT VIC AB 2-0 CT1 18 (SUTURE) ×3 IMPLANT
SUT VIC AB 4-0 PS2 27 (SUTURE) ×3 IMPLANT
TOWEL GREEN STERILE (TOWEL DISPOSABLE) ×3 IMPLANT
TOWEL GREEN STERILE FF (TOWEL DISPOSABLE) ×3 IMPLANT
TRAY FOLEY MTR SLVR 14FR STAT (SET/KITS/TRAYS/PACK) ×1 IMPLANT
TRAY FOLEY MTR SLVR 16FR STAT (SET/KITS/TRAYS/PACK) ×2 IMPLANT
TUBE CONNECTING 20X1/4 (TUBING) ×1 IMPLANT
WATER STERILE IRR 1000ML POUR (IV SOLUTION) ×3 IMPLANT

## 2022-05-03 NOTE — Anesthesia Preprocedure Evaluation (Signed)
Anesthesia Evaluation  Patient identified by MRN, date of birth, ID band Patient awake    Reviewed: Allergy & Precautions, NPO status , Patient's Chart, lab work & pertinent test results  History of Anesthesia Complications Negative for: history of anesthetic complications  Airway Mallampati: I  TM Distance: >3 FB Neck ROM: Full    Dental  (+) Teeth Intact, Dental Advisory Given   Pulmonary neg shortness of breath, neg sleep apnea, neg COPD, neg recent URI,    breath sounds clear to auscultation       Cardiovascular hypertension, Pt. on medications and Pt. on home beta blockers (-) angina(-) Past MI and (-) CHF  Rhythm:Regular     Neuro/Psych  Neuromuscular disease negative psych ROS   GI/Hepatic hiatal hernia, GERD  ,(+) Hepatitis -, Autoimmune  Endo/Other  negative endocrine ROS  Renal/GU Renal InsufficiencyRenal diseaseLab Results      Component                Value               Date                      CREATININE               1.14 (H)            04/22/2022                Musculoskeletal  (+) Arthritis ,   Abdominal   Peds  Hematology Lab Results      Component                Value               Date                      WBC                      7.2                 04/22/2022                HGB                      12.6                04/22/2022                HCT                      37.5                04/22/2022                MCV                      95.4                04/22/2022                PLT                      244                 04/22/2022              Anesthesia Other Findings   Reproductive/Obstetrics  Anesthesia Physical Anesthesia Plan  ASA: 2  Anesthesia Plan: General   Post-op Pain Management: Ofirmev IV (intra-op)*   Induction: Intravenous  PONV Risk Score and Plan: 3 and Ondansetron and Dexamethasone  Airway Management  Planned: Oral ETT  Additional Equipment: None  Intra-op Plan:   Post-operative Plan: Extubation in OR  Informed Consent: I have reviewed the patients History and Physical, chart, labs and discussed the procedure including the risks, benefits and alternatives for the proposed anesthesia with the patient or authorized representative who has indicated his/her understanding and acceptance.     Dental advisory given  Plan Discussed with: CRNA  Anesthesia Plan Comments:         Anesthesia Quick Evaluation

## 2022-05-03 NOTE — Transfer of Care (Signed)
Immediate Anesthesia Transfer of Care Note  Patient: Brittney Clark  Procedure(s) Performed: Posterior Lumbar Interbody Fusion  Lumbar four-five extension of fusion (Back)  Patient Location: PACU  Anesthesia Type:General  Level of Consciousness: drowsy  Airway & Oxygen Therapy: Patient Spontanous Breathing and Patient connected to nasal cannula oxygen  Post-op Assessment: Report given to RN, Post -op Vital signs reviewed and stable and Patient moving all extremities X 4  Post vital signs: Reviewed and stable  Last Vitals:  Vitals Value Taken Time  BP 131/77 05/03/22 1438  Temp    Pulse 51 05/03/22 1440  Resp 7 05/03/22 1440  SpO2 96 % 05/03/22 1440  Vitals shown include unvalidated device data.  Last Pain:  Vitals:   05/03/22 0936  PainSc: 4       Patients Stated Pain Goal: 2 (30/86/57 8469)  Complications: No notable events documented.

## 2022-05-03 NOTE — Op Note (Signed)
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  2.  Posterior lumbar interbody fusion L4-5 utilizing the globus titanium insert and rotate titanium cages packed with locally harvested autograft mixed with Vivigen and Proteus  3.  Cortical screw fixation L4-5 with placement of new NuVasive cortical screws at L4 tying into previous lead placed cortical screws at L5 and S1  4.  Exploration fusion removal of hardware L5-S1  Surgeon: Dominica Severin Danyal Whitenack  Assistant: Nash Shearer  Anesthesia: General  EBL: Minimal  HPI: 81 year old female longstanding back pain previously undergone L5-S1 fusion developed progressive worsening back and bilateral hip and leg pain work-up revealed segmental degeneration and severe spinal stenosis at L4-5 above her previous fusion.  Due to her progression of clinical syndrome imaging findings of a conservative treatment I recommended decompressive laminectomy and interbody fusion at that level.  I extensively reviewed the risks and benefits of that operation with her as well as perioperative course expectations of outcome and alternatives of surgery and she understands and agrees to proceed forward.  Operative procedure: Patient was brought into the OR was D[facility]on general anesthesia positioned prone the Wilson frame her back was prepped and draped in routine sterile fashion roll incision was opened up and extended cephalad subperiosteal dissection was carried lamina of L4 and exposed hardware at L4-5 also exposed the cortical screw entry points at L4.   Disconnected hardware remove the knots remove the rods and then I remove the spinous process at L4 and begin the decompression.  Central decompression was begun complete medial facetectomies were performed and radical foraminotomies were performed of the L4 nerve roots bilaterally.  Then I freed up the scar tissue and performed redo for and foraminotomies of the L5 nerve roots bilaterally.  Aggressively under bit the supra reticulating facet removed the pars interarticularis to gain access to the lateral margin disc base.  Disc base was then incised cleaned out bilaterally endplates were prepared and inserted titanium cages packed with locally harvested autograft mixed with an extensive mount of autograft mix packed centrally.  Then I placed 2 cortical screws at L4 all of this was done under fluoroscopy and confirmed good position of all the implants.  Then assembled the heads advanced the screws inserted along the rod from L4 down to S1 prior to this I did inspect the fusion L5-S1 it did appear to be solid.  I reinspected the foramina to confirm patency I compressed L4 against L5 and locked everything in.  Then lay Gelfoam in the dura placed a medium Hemovac drain and injected Exparel in the fascia.  The wound was then closed with interrupted Vicryl and a running 4 subcuticular.  Dermabond benzoin Steri-Strips and a sterile dressing was applied patient recovery room in stable condition.  At the end the case all needle count sponge counts were correct.

## 2022-05-03 NOTE — Progress Notes (Signed)
Pharmacy Antibiotic Note  Brittney Clark is a 81 y.o. female admitted on 05/03/2022 with  spinal stenosis for surgery .  Pharmacy has been consulted for vancomycin dosing wile drain in place.   Plan: Vancomycin 750 IV every 24 hours.  Goal trough 10-15 mcg/mL.  Height: '5\' 4"'$  (162.6 cm) Weight: 68 kg (150 lb) IBW/kg (Calculated) : 54.7  Temp (24hrs), Avg:97.6 F (36.4 C), Min:97.2 F (36.2 C), Max:97.9 F (36.6 C)  No results for input(s): WBC, CREATININE, LATICACIDVEN, VANCOTROUGH, VANCOPEAK, VANCORANDOM, GENTTROUGH, GENTPEAK, GENTRANDOM, TOBRATROUGH, TOBRAPEAK, TOBRARND, AMIKACINPEAK, AMIKACINTROU, AMIKACIN in the last 168 hours.  Estimated Creatinine Clearance: 38.5 mL/min (A) (by C-G formula based on SCr of 1.14 mg/dL (H)).    Allergies  Allergen Reactions   Amlodipine Besy-Benazepril Hcl Other (See Comments)    Hair loss   Clonidine Other (See Comments)    Hair loss   Diltiazem Other (See Comments)    Hair loss   Hydralazine Other (See Comments)   Penicillins Rash    Has patient had a PCN reaction causing immediate rash, facial/tongue/throat swelling, SOB or lightheadedness with hypotension: No Has patient had a PCN reaction causing severe rash involving mucus membranes or skin necrosis: No Has patient had a PCN reaction that required hospitalization No Has patient had a PCN reaction occurring within the last 10 years: No If all of the above answers are "NO", then may proceed with Cephalosporin use.     Thank you for allowing pharmacy to be a part of this patient's care.  Erin Hearing PharmD., BCPS Clinical Pharmacist 05/03/2022 7:35 PM

## 2022-05-03 NOTE — Anesthesia Procedure Notes (Signed)
Procedure Name: Intubation Date/Time: 05/03/2022 11:42 AM Performed by: Kyung Rudd, CRNA Pre-anesthesia Checklist: Patient identified, Emergency Drugs available, Suction available and Patient being monitored Patient Re-evaluated:Patient Re-evaluated prior to induction Oxygen Delivery Method: Circle system utilized Preoxygenation: Pre-oxygenation with 100% oxygen Induction Type: IV induction Ventilation: Mask ventilation without difficulty Laryngoscope Size: Mac and 4 Grade View: Grade I Tube type: Oral Tube size: 7.0 mm Number of attempts: 1 Airway Equipment and Method: Stylet Placement Confirmation: ETT inserted through vocal cords under direct vision, positive ETCO2 and breath sounds checked- equal and bilateral Secured at: 21 cm Tube secured with: Tape Dental Injury: Teeth and Oropharynx as per pre-operative assessment

## 2022-05-03 NOTE — H&P (Signed)
Brittney Clark is an 81 y.o. female.   Chief Complaint: Back bilateral hip and leg pain HPI: 81 year old female with longstanding back pain previously undergone L5-S1 decompression fusion presents with progressive worsening back and bilateral leg pain consistent with L5 nerve root pattern work-up revealed segmental degeneration progressive spinal stenosis at L4-5 with instability.  Due to patient's progression of clinical syndrome imaging findings and failed conservative treatment I recommended reexploration fusion move the hardware and decompressive laminectomy interbody fusion at L4-5.  I extensively reviewed the risks and benefits of the operation with her as well as perioperative course expectations of outcome and alternatives of surgery and she understands and agrees to proceed forward.  Past Medical History:  Diagnosis Date   Anemia    Arthritis    Autoimmune hepatitis ([place])    DDD (degenerative disc disease), lumbar    GERD (gastroesophageal reflux disease)    Gout    Hypertension    Immune deficiency disorder ([place])    Internal hemorrhoids    Leukopenia    Venous stasis     Past Surgical History:  Procedure Laterality Date   ABDOMINAL HYSTERECTOMY     APPENDECTOMY     BREAST BIOPSY Left 1980   neg   BREAST SURGERY     LEFT OOPHORECTOMY     MAXIMUM ACCESS (MAS)POSTERIOR LUMBAR INTERBODY FUSION (PLIF) 1 LEVEL N/A 10/27/2015   Procedure: FOR MAXIMUM ACCESS (MAS) POSTERIOR LUMBAR INTERBODY FUSION (PLIF) L5-S1 MAS with resection of synovial cyst;  Surgeon: Erline Levine, MD;  Location: [place] [facility];  Service: Neurosurgery;  Laterality: N/A;   NECK SURGERY  2001   ROTATOR CUFF REPAIR Right 2014    Family History  Problem Relation Age of Onset   Breast cancer Paternal Aunt    Stroke Mother    Aneurysm Father    Stroke Paternal Grandfather    Diabetes Neg Hx    Social History:  reports that she has never smoked. She has never used smokeless tobacco. She reports that she does  not drink alcohol and does not use drugs.  Allergies:  Allergies  Allergen Reactions   Amlodipine Besy-Benazepril Hcl Other (See Comments)    Hair loss   Clonidine Other (See Comments)    Hair loss   Diltiazem Other (See Comments)    Hair loss   Hydralazine Other (See Comments)   Penicillins Rash    Has patient had a PCN reaction causing immediate rash, facial/tongue/throat swelling, SOB or lightheadedness with hypotension: No Has patient had a PCN reaction causing severe rash involving mucus membranes or skin necrosis: No Has patient had a PCN reaction that required hospitalization No Has patient had a PCN reaction occurring within the last 10 years: No If all of the above answers are "NO", then may proceed with Cephalosporin use.     Medications Prior to Admission  Medication Sig Dispense Refill   aspirin EC 81 MG tablet Take 81 mg by mouth daily.     azaTHIOprine (IMURAN) 50 MG tablet Take 50 mg by mouth daily.     Calcium-Magnesium-Vitamin D (CALCIUM 1200+D3 PO) Take 1 tablet by mouth in the morning and at bedtime.     carvedilol (COREG) 25 MG tablet Take 25 mg by mouth 2 (two) times daily with a meal.     DILT-XR 240 MG 24 hr capsule Take 240 mg by mouth daily.     isosorbide mononitrate (IMDUR) 120 MG 24 hr tablet Take 120 mg by mouth daily.  omeprazole (PRILOSEC) 40 MG capsule Take 40 mg by mouth 2 (two) times daily.     predniSONE (DELTASONE) 5 MG tablet Take 5 mg by mouth daily.     vitamin B-12 (CYANOCOBALAMIN) 500 MCG tablet Take 500 mcg by mouth daily.      No results found for this or any previous visit (from the past 48 hour(s)). No results found.  Review of Systems  Musculoskeletal:  Positive for back pain.  Neurological:  Positive for numbness.   Blood pressure 139/88, pulse (!) 52, temperature 97.6 F (36.4 C), resp. rate 18, height '5\' 4"'$  (1.626 m), weight 68 kg, SpO2 93 %. Physical Exam HENT:     Head: Normocephalic.     Right Ear: Tympanic membrane  normal.     Nose: Nose normal.     Mouth/Throat:     Mouth: Mucous membranes are moist.  Cardiovascular:     Rate and Rhythm: Normal rate.  Pulmonary:     Effort: Pulmonary effort is normal.  Abdominal:     General: Abdomen is flat.  Musculoskeletal:        General: Normal range of motion.  Skin:    General: Skin is warm.  Neurological:     General: No focal deficit present.     Mental Status: She is alert.     Comments: Patient is awake and alert strength is 5 out of 5 iliopsoas, quads, hamstrings, gastrocs, and tibialis, and EHL.     Assessment/Plan 81 year old female presents for decompression fusion L4-5  Elaina Hoops, MD 05/03/2022, 10:27 AM

## 2022-05-04 DIAGNOSIS — M48062 Spinal stenosis, lumbar region with neurogenic claudication: Secondary | ICD-10-CM | POA: Diagnosis not present

## 2022-05-04 MED ORDER — HYDROCODONE-ACETAMINOPHEN 5-325 MG PO TABS: 1.0000 | ORAL_TABLET | ORAL | 0 refills | Status: AC | PRN

## 2022-05-04 MED ORDER — CYCLOBENZAPRINE HCL 10 MG PO TABS: 10.0000 mg | ORAL_TABLET | Freq: Three times a day (TID) | ORAL | 0 refills | Status: AC | PRN

## 2022-05-04 NOTE — Progress Notes (Signed)
Neurosurgery Service Progress Note  Subjective: No acute events overnight, preop symptoms dramatically improved, she's very happy   Objective: Vitals:   05/03/22 1936 05/03/22 2306 05/04/22 0503 05/04/22 0725  BP: 139/87 (!) 141/85 (!) 160/84 (!) 165/85  Pulse: (!) 54 (!) 55 (!) 59 67  Resp: '16 16 18 18  '$ Temp: 98.2 F (36.8 C) 97.7 F (36.5 C) 98.5 F (36.9 C) 97.9 F (36.6 C)  TempSrc: Oral Oral Oral Oral  SpO2: 97% 99% 97% 98%  Weight:      Height:        Physical Exam: Strength 5/5 x4 and SILTx4   Assessment & Plan: 81 y.o. woman s/p L4-5 PLIF, recovering well.  -discharge home today  Judith Part  05/04/22 7:34 AM

## 2022-05-04 NOTE — Discharge Summary (Signed)
Discharge Summary  Date of Admission: 05/03/2022  Date of Discharge: 05/04/22  Attending Physician: Kary Kos, MD  Hospital Course: Patient was admitted following an uncomplicated Z6-1 PLIF. They were recovered in PACU and transferred to [facility]. Their preop symptoms were completely resolved, their hospital course was uncomplicated and the patient was discharged home on 05/04/22. They will follow up in clinic with me in clinic in 2 weeks.  Neurologic exam at discharge:  Strength 5/5 x4 and SILTx4   Discharge diagnosis: Lumbar spondylolisthesis, lumbar radiculopathy  Judith Part, MD 05/04/22 7:35 AM

## 2022-05-04 NOTE — Evaluation (Signed)
Occupational Therapy Evaluation Patient Details Name: Brittney Clark MRN: [id number] DOB: [birth year] Today's Date: 05/04/2022   History of Present Illness 81 y/o female admitted on 05/03/22 following L4-5 PLIF and hardware removal L5-S1. PMH: HTN, leukopenia, autoimmune hepatitis   Clinical Impression   Pt admitted for procedure listed above. PTA pt reported that she was independent with all ADL's and IADL's, using no AD. At this time, pt presents near her baseline, limited minutely by pain/soreness. She is able to complete all ADL's and functional mobility with independence. OT provided education and compensatory strategies for following spinal precautions. She has no further OT needs and acute OT will sign off.       Recommendations for follow up therapy are one component of a multi-disciplinary discharge planning process, led by the attending physician.  Recommendations may be updated based on patient status, additional functional criteria and insurance authorization.   Follow Up Recommendations  No OT follow up    Assistance Recommended at Discharge PRN  Patient can return home with the following Assistance with cooking/housework    Functional Status Assessment  Patient has had a recent decline in their functional status and demonstrates the ability to make significant improvements in function in a reasonable and predictable amount of time.  Equipment Recommendations  None recommended by OT    Recommendations for Other Services       Precautions / Restrictions Precautions Precautions: Back Precaution Booklet Issued: Yes (comment) Precaution Comments: Reviewed back precautions and compensatory strategies Required Braces or Orthoses: Spinal Brace Spinal Brace: Lumbar corset Restrictions Weight Bearing Restrictions: No      Mobility Bed Mobility Overal bed mobility: Modified Independent             General bed mobility comments: reviewed log roll, she needs no  assist    Transfers Overall transfer level: Modified independent Equipment used: None               General transfer comment: no concerns      Balance Overall balance assessment: No apparent balance deficits (not formally assessed)                                         ADL either performed or assessed with clinical judgement   ADL Overall ADL's : Modified independent                                       General ADL Comments: Following all precautions with compensatory strategies     Vision Baseline Vision/History: 1 Wears glasses Ability to See in Adequate Light: 0 Adequate Patient Visual Report: No change from baseline Vision Assessment?: No apparent visual deficits     Perception     Praxis      Pertinent Vitals/Pain Pain Assessment Pain Assessment: No/denies pain     Hand Dominance Right   Extremity/Trunk Assessment Upper Extremity Assessment Upper Extremity Assessment: Overall WFL for tasks assessed   Lower Extremity Assessment Lower Extremity Assessment: Defer to PT evaluation   Cervical / Trunk Assessment Cervical / Trunk Assessment: Back Surgery   Communication Communication Communication: No difficulties   Cognition Arousal/Alertness: Awake/alert Behavior During Therapy: WFL for tasks assessed/performed Overall Cognitive Status: Within Functional Limits for tasks assessed  General Comments  VSS on RA, daughter present in room    Exercises     Shoulder Instructions      Home Living Family/patient expects to be discharged to:: Private residence Living Arrangements: Spouse/significant other Available Help at Discharge: Family Type of Home: House Home Access: Level entry     [place]: One level     Bathroom Shower/Tub: Walk-in shower;Tub/shower unit   Bathroom Toilet: Handicapped height     Home Equipment: Conservation officer, nature (2  wheels);Rollator (4 wheels);Shower seat - built in          Prior Functioning/Environment Prior Level of Function : Independent/Modified Independent;Driving                        OT Problem List: Decreased strength;Decreased activity tolerance;Impaired balance (sitting and/or standing);Decreased safety awareness;Pain      OT Treatment/Interventions:      OT Goals(Current goals can be found in the care plan section) Acute Rehab OT Goals Patient Stated Goal: To go home OT Goal Formulation: All assessment and education complete, DC therapy Time For Goal Achievement: 05/04/22 Potential to Achieve Goals: Good  OT Frequency:      Co-evaluation              AM-PAC OT "6 Clicks" Daily Activity     Outcome Measure Help from another person eating meals?: None Help from another person taking care of personal grooming?: None Help from another person toileting, which includes using toliet, bedpan, or urinal?: None Help from another person bathing (including washing, rinsing, drying)?: None Help from another person to put on and taking off regular upper body clothing?: None Help from another person to put on and taking off regular lower body clothing?: None 6 Click Score: 24   End of Session Equipment Utilized During Treatment: Back brace Nurse Communication: Mobility status  Activity Tolerance: Patient tolerated treatment well Patient left: in bed;with call bell/phone within reach;with family/visitor present  OT Visit Diagnosis: Unsteadiness on feet (R26.81);Muscle weakness (generalized) (M62.81)                Time: 1165-7903 OT Time Calculation (min): 17 min Charges:  OT General Charges $OT Visit: 1 Visit OT Evaluation $OT Eval Low Complexity: [place]., OTR/L Acute Rehabilitation  Brittney Clark Elane Shelisha Gautier 05/04/2022, 8:20 AM

## 2022-05-04 NOTE — Evaluation (Signed)
Physical Therapy Evaluation & Discharge Patient Details Name: Brittney Clark MRN: [id number] DOB: [birth year] Today's Date: 05/04/2022  History of Present Illness  81 y/o female admitted on 05/03/22 following L4-5 PLIF and hardware removal L5-S1. PMH: HTN, leukopenia, autoimmune hepatitis  Clinical Impression  Patient admitted following above procedure. Patient functioning at modI level with no AD for mobility. Educated patient on back precautions, brace wear, and activity progression, patient demonstrated understanding. Patient feels like symptoms have improved since surgery. Patient will have necessary assistance at home by husband and daughter at discharge. No further skilled PT needs identified acutely. No PT follow up recommended at this time.        Recommendations for follow up therapy are one component of a multi-disciplinary discharge planning process, led by the attending physician.  Recommendations may be updated based on patient status, additional functional criteria and insurance authorization.  Follow Up Recommendations No PT follow up    Assistance Recommended at Discharge PRN  Patient can return home with the following       Equipment Recommendations None recommended by PT  Recommendations for Other Services       Functional Status Assessment Patient has had a recent decline in their functional status and demonstrates the ability to make significant improvements in function in a reasonable and predictable amount of time.     Precautions / Restrictions Precautions Precautions: Back Precaution Booklet Issued: Yes (comment) Precaution Comments: Reviewed back precautions and compensatory strategies Required Braces or Orthoses: Spinal Brace Spinal Brace: Lumbar corset Restrictions Weight Bearing Restrictions: No      Mobility  Bed Mobility               General bed mobility comments: sitting on EOB on arrival    Transfers Overall transfer level: Modified  independent Equipment used: None                    Ambulation/Gait Ambulation/Gait assistance: Modified independent (Device/Increase time) Gait Distance (Feet): 300 Feet Assistive device: None Gait Pattern/deviations: WFL(Within Functional Limits)   Gait velocity interpretation: >4.37 ft/sec, indicative of normal walking speed      Stairs            Wheelchair Mobility    Modified Rankin (Stroke Patients Only)       Balance Overall balance assessment: No apparent balance deficits (not formally assessed)                                           Pertinent Vitals/Pain Pain Assessment Pain Assessment: No/denies pain    Home Living Family/patient expects to be discharged to:: Private residence Living Arrangements: Spouse/significant other Available Help at Discharge: Family Type of Home: House Home Access: Level entry       Home Layout: One level Home Equipment: Conservation officer, nature (2 wheels);Rollator (4 wheels);Shower seat - built in      Prior Function Prior Level of Function : Independent/Modified Independent;Driving                     Hand Dominance   Dominant Hand: Right    Extremity/Trunk Assessment   Upper Extremity Assessment Upper Extremity Assessment: Defer to OT evaluation    Lower Extremity Assessment Lower Extremity Assessment: Overall WFL for tasks assessed    Cervical / Trunk Assessment Cervical / Trunk Assessment: Back Surgery  Communication   Communication: No  difficulties  Cognition Arousal/Alertness: Awake/alert Behavior During Therapy: WFL for tasks assessed/performed Overall Cognitive Status: Within Functional Limits for tasks assessed                                          General Comments General comments (skin integrity, edema, etc.): VSS on RA, daughter present in room    Exercises     Assessment/Plan    PT Assessment Patient does not need any further PT services   PT Problem List         PT Treatment Interventions      PT Goals (Current goals can be found in the Care Plan section)  Acute Rehab PT Goals Patient Stated Goal: to go home PT Goal Formulation: All assessment and education complete, DC therapy    Frequency       Co-evaluation               AM-PAC PT "6 Clicks" Mobility  Outcome Measure Help needed turning from your back to your side while in a flat bed without using bedrails?: None Help needed moving from lying on your back to sitting on the side of a flat bed without using bedrails?: None Help needed moving to and from a bed to a chair (including a wheelchair)?: None Help needed standing up from a chair using your arms (e.g., wheelchair or bedside chair)?: None Help needed to walk in hospital room?: None Help needed climbing 3-5 steps with a railing? : None 6 Click Score: 24    End of Session Equipment Utilized During Treatment: Back brace Activity Tolerance: Patient tolerated treatment well Patient left: in bed;with call bell/phone within reach;with family/visitor present Nurse Communication: Mobility status PT Visit Diagnosis: Muscle weakness (generalized) (M62.81)    Time: 6314-9702 PT Time Calculation (min) (ACUTE ONLY): 14 min   Charges:   PT Evaluation $PT Eval Low Complexity: 1 Low          Rihaan Barrack A. Gilford Rile PT, DPT Acute Rehabilitation Services Pager [phone] Office [phone]   Linna Hoff 05/04/2022, 9:30 AM

## 2022-05-04 NOTE — Plan of Care (Signed)

## 2022-05-04 NOTE — Progress Notes (Signed)
Patient alert and oriented, mae's well, voiding adequate amount of urine, swallowing without difficulty, no c/o pain at time of discharge. Patient discharged home with family. Script and discharged instructions given to patient. Patient and family stated understanding of instructions given. Patient has an appointment with Dr. Cram 

## 2022-05-04 NOTE — Discharge Instructions (Signed)
Wound Care  Keep the incision clean and dry remove the outer dressing in 3 days Do not put any creams, lotions, or ointments on incision. Leave steri-strips on back.  They will fall off by themselves.  Activity Walk each and every day, increasing distance each day. No lifting greater than 5 lbs.  No lifting no bending no twisting no driving or riding a car unless coming back and forth to see me. If provided with back brace, wear when out of bed.  It is not necessary to wear brace in bed. Diet Resume your normal diet.    Call Your Doctor If Any of These Occur Redness, drainage, or swelling at the wound.  Temperature greater than 101 degrees. Severe pain not relieved by pain medication. Incision starts to come apart. Follow Up Appt Call today for appointment in 1-2 weeks ([phone]) or for problems.  If you have any hardware placed in your spine, you will need an x-ray before your appointment.

## 2022-05-07 NOTE — Anesthesia Postprocedure Evaluation (Signed)
Anesthesia Post Note  Patient: Brittney Clark  Procedure(s) Performed: Posterior Lumbar Interbody Fusion  Lumbar four-five extension of fusion (Back)     Patient location during evaluation: PACU Anesthesia Type: General Level of consciousness: awake and alert Pain management: pain level controlled Vital Signs Assessment: post-procedure vital signs reviewed and stable Respiratory status: spontaneous breathing, nonlabored ventilation, respiratory function stable and patient connected to nasal cannula oxygen Cardiovascular status: blood pressure returned to baseline and stable Postop Assessment: no apparent nausea or vomiting Anesthetic complications: no   No notable events documented.  Last Vitals:  Vitals:   05/04/22 0503 05/04/22 0725  BP: (!) 160/84 (!) 165/85  Pulse: (!) 59 67  Resp: 18 18  Temp: 36.9 C 36.6 C  SpO2: 97% 98%    Last Pain:  Vitals:   05/04/22 1003  TempSrc:   PainSc: 3                  Omere Marti

## 2022-06-17 ENCOUNTER — Inpatient Hospital Stay: Payer: Medicare HMO

## 2022-06-17 ENCOUNTER — Inpatient Hospital Stay: Payer: Medicare HMO | Attending: Oncology

## 2022-06-17 ENCOUNTER — Inpatient Hospital Stay (HOSPITAL_BASED_OUTPATIENT_CLINIC_OR_DEPARTMENT_OTHER): Payer: Medicare HMO | Admitting: Nurse Practitioner

## 2022-06-17 ENCOUNTER — Other Ambulatory Visit: Payer: Self-pay

## 2022-06-17 VITALS — BP 121/68 | HR 56

## 2022-06-17 VITALS — BP 94/67 | HR 58 | Temp 97.1°F | Resp 16 | Wt 155.0 lb

## 2022-06-17 DIAGNOSIS — D509 Iron deficiency anemia, unspecified: Secondary | ICD-10-CM | POA: Diagnosis present

## 2022-06-17 LAB — CBC WITH DIFFERENTIAL/PLATELET
Abs Immature Granulocytes: 0.06 10*3/uL (ref 0.00–0.07)
Basophils Absolute: 0 10*3/uL (ref 0.0–0.1)
Basophils Relative: 1 %
Eosinophils Absolute: 0 10*3/uL (ref 0.0–0.5)
Eosinophils Relative: 0 %
HCT: 26.5 % — ABNORMAL LOW (ref 36.0–46.0)
Hemoglobin: 8.6 g/dL — ABNORMAL LOW (ref 12.0–15.0)
Immature Granulocytes: 1 %
Lymphocytes Relative: 11 %
Lymphs Abs: 0.7 10*3/uL (ref 0.7–4.0)
MCH: 31.9 pg (ref 26.0–34.0)
MCHC: 32.5 g/dL (ref 30.0–36.0)
MCV: 98.1 fL (ref 80.0–100.0)
Monocytes Absolute: 0.4 10*3/uL (ref 0.1–1.0)
Monocytes Relative: 7 %
Neutro Abs: 5 10*3/uL (ref 1.7–7.7)
Neutrophils Relative %: 80 %
Platelets: 197 10*3/uL (ref 150–400)
RBC: 2.7 MIL/uL — ABNORMAL LOW (ref 3.87–5.11)
RDW: 15.7 % — ABNORMAL HIGH (ref 11.5–15.5)
WBC: 6.1 10*3/uL (ref 4.0–10.5)
nRBC: 0 % (ref 0.0–0.2)

## 2022-06-17 LAB — IRON AND TIBC
Iron: 88 ug/dL (ref 28–170)
Saturation Ratios: 29 % (ref 10.4–31.8)
TIBC: 304 ug/dL (ref 250–450)
UIBC: 216 ug/dL

## 2022-06-17 LAB — FERRITIN: Ferritin: 83 ng/mL (ref 11–307)

## 2022-06-17 MED ORDER — SODIUM CHLORIDE 0.9% FLUSH
10.0000 mL | Freq: Once | INTRAVENOUS | Status: DC | PRN
  Administered 2022-06-17: 10 mL
  Filled 2022-06-17: qty 10

## 2022-06-17 MED ORDER — SODIUM CHLORIDE 0.9 % IV SOLN
Freq: Once | INTRAVENOUS | Status: AC
  Filled 2022-06-17: qty 250

## 2022-06-17 MED ORDER — SODIUM CHLORIDE 0.9 % IV SOLN
200.0000 mg | Freq: Once | INTRAVENOUS | Status: AC
  Administered 2022-06-17: 200 mg via INTRAVENOUS
  Filled 2022-06-17: qty 10

## 2022-06-17 NOTE — Progress Notes (Signed)
Follow-up iron deficiency anemia. Reports bloody stools yesterday with subsequent ER visit. Being treated for diverticulitis. No further bleeding. Denies dizziness or shortness of breath. Endorses fatigue.

## 2022-06-17 NOTE — Progress Notes (Signed)
Patient tolerated Venofer infusion well, no questions/concerns voiced. Patient stable at discharge. Refused AVS .   

## 2022-06-17 NOTE — Patient Instructions (Signed)

## 2022-06-17 NOTE — Progress Notes (Signed)
Hematology/Oncology Consult Note [facility]  [phone] [phone]  Patient Care Team: Adin Hector, MD as PCP - General (Internal Medicine)   Name of the patient: Brittney Clark  [id number]  Mar 17, 1941   Date of visit: 06/17/22  Diagnosis- 1. 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 IgG MGUS  Chief complaint/ Reason for visit- routine follow-up of anemia  Heme/Onc history: Patient is a 81 year old female with a history of autoimmune hepatitis diagnosed in 2005 who is on Imuran and sees [facility].  She was also seen recently by rheumatology for symptoms of polyarthralgia.  During that time she had some blood work done which showed abnormal SPEP and hence the patient has been referred to [place]. Most recent CBC from 05/26/2019 showed white count of 2.6, H&H of 10.9/33.5 and a platelet count of 277.  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  She also has normocytic anemia and her baseline hemoglobin runs between 10-11.  Her white count has been a little lower than her baseline of 3-4 since she came off prednisone.  Patient mainly currently reports joint pain and some fatigue   Results of blood work from 06/03/2019 were as follows: CBC showed white count of 3.4, H&H of 10.9/33 with an MCV of 89 and platelet count of 243.  ANC was 1.6.  Smear review showed occasional smudge cells and some abnormal lymphocytes.  Ferritin levels were low at 16.  Iron studies B12 folate was normal.  TSH was normal reticulocyte count was 1.3 mildly low for the degree of anemia.  Haptoglobin was normal at 150.  Multiple myeloma panel showed IgG lambda M protein of 0.3 g.  Both kappa and lambda free light chains were elevated with a normal free light chain ratio of 1.4.  Interval history- Patient is 81 year old female with above  history of iron deficiency who returns to clinic for laboratory evaluation and follow up. In interim she underwent L4-L5 PLIF with Dr. Saintclair Halsted, neurosurgery. Was seen in ER yesterday for diverticular bleed secondary to constipation. Reports that this has resolved. She was prescribed cipro and flagyl. 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 She has ongoing fatigue and general malaise. Feels run down. She requested to move up her appointment due to her symptoms and recent bleeding episode.   ECOG PS- 1 Pain scale- 0   Review of systems- Review of Systems  Constitutional:  Positive for malaise/fatigue. Negative for chills, fever and weight loss.  HENT:  Negative for congestion, ear discharge and nosebleeds.   Eyes:  Negative for blurred vision.  Respiratory:  Negative for cough, hemoptysis, sputum production, shortness of breath and wheezing.   Cardiovascular:  Negative for chest pain, palpitations, orthopnea and claudication.  Gastrointestinal:  Negative for abdominal pain, blood in stool, constipation, diarrhea, heartburn, melena, nausea and vomiting.  Genitourinary:  Negative for dysuria, flank pain, frequency, hematuria and urgency.  Musculoskeletal:  Negative for back pain, joint pain and myalgias.  Skin:  Negative for rash.  Neurological:  Negative for dizziness, tingling, focal weakness, seizures, weakness and headaches.  Endo/Heme/Allergies:  Does not bruise/bleed easily.  Psychiatric/Behavioral:  Negative for depression and suicidal ideas. The patient does not have insomnia.      Allergies  Allergen Reactions   Ferrlecit [Na Ferric Gluc Cplx  In Sucrose] Shortness Of Breath and Other (See Comments)    History of allergic reaction to ferric gluconate. Chest pain required ER evaluation   Amlodipine Besy-Benazepril Hcl Other (See Comments)    Hair loss   Clonidine Other (See Comments)    Hair loss   Diltiazem Other (See Comments)    Hair loss    Hydralazine Other (See Comments)   Penicillins Rash    Has patient had a PCN reaction causing immediate rash, facial/tongue/throat swelling, SOB or lightheadedness with hypotension: No Has patient had a PCN reaction causing severe rash involving mucus membranes or skin necrosis: No Has patient had a PCN reaction that required hospitalization No Has patient had a PCN reaction occurring within the last 10 years: No If all of the above answers are "NO", then may proceed with Cephalosporin use.     Past Medical History:  Diagnosis Date   Anemia    Arthritis    Autoimmune hepatitis ([place])    DDD (degenerative disc disease), lumbar    GERD (gastroesophageal reflux disease)    Gout    Hypertension    Immune deficiency disorder ([place])    Internal hemorrhoids    Leukopenia    Venous stasis     Past Surgical History:  Procedure Laterality Date   ABDOMINAL HYSTERECTOMY     APPENDECTOMY     BREAST BIOPSY Left 1980   neg   BREAST SURGERY     LEFT OOPHORECTOMY     MAXIMUM ACCESS (MAS)POSTERIOR LUMBAR INTERBODY FUSION (PLIF) 1 LEVEL N/A 10/27/2015   Procedure: FOR MAXIMUM ACCESS (MAS) POSTERIOR LUMBAR INTERBODY FUSION (PLIF) L5-S1 MAS with resection of synovial cyst;  Surgeon: Erline Levine, MD;  Location: [facility];  Service: Neurosurgery;  Laterality: N/A;   NECK SURGERY  2001   ROTATOR CUFF REPAIR Right 2014    Social History   Socioeconomic History   Marital status: Married    Spouse name: Not on file   Number of children: 2   Years of education: Not on file   Highest education level: Not on file  Occupational History   Not on file  Tobacco Use   Smoking status: Never   Smokeless tobacco: Never  Vaping Use   Vaping Use: Never used  Substance and Sexual Activity   Alcohol use: No   Drug use: Never   Sexual activity: Yes  Other Topics Concern   Not on file  Social History Narrative   Not on file   Social Determinants of Health   Financial Resource Strain: Not on  file  Food Insecurity: Not on file  Transportation Needs: Not on file  Physical Activity: Not on file  Stress: Not on file  Social Connections: Not on file  Intimate Partner Violence: Not on file    Family History  Problem Relation Age of Onset   Breast cancer Paternal Aunt    Stroke Mother    Aneurysm Father    Stroke Paternal Grandfather    Diabetes Neg Hx      Current Outpatient Medications:    aspirin EC 81 MG tablet, Take 81 mg by mouth daily., Disp: , Rfl:    azaTHIOprine (IMURAN) 50 MG tablet, Take 50 mg by mouth daily., Disp: , Rfl:    Calcium-Magnesium-Vitamin D (CALCIUM 1200+D3 PO), Take 1 tablet by mouth in the morning and at bedtime., Disp: , Rfl:    carvedilol (COREG) 25 MG tablet, Take 25 mg by mouth 2 (two) times daily with a meal.,  Disp: , Rfl:    DILT-XR 240 MG 24 hr capsule, Take 240 mg by mouth daily., Disp: , Rfl:    HYDROcodone-acetaminophen (NORCO/VICODIN) 5-325 MG tablet, Take 1-2 tablets by mouth every 4 (four) hours as needed (pain)., Disp: 30 tablet, Rfl: 0   isosorbide mononitrate (IMDUR) 120 MG 24 hr tablet, Take 120 mg by mouth daily., Disp: , Rfl:    omeprazole (PRILOSEC) 40 MG capsule, Take 40 mg by mouth 2 (two) times daily., Disp: , Rfl:    predniSONE (DELTASONE) 5 MG tablet, Take 5 mg by mouth daily., Disp: , Rfl:    vitamin B-12 (CYANOCOBALAMIN) 500 MCG tablet, Take 500 mcg by mouth daily., Disp: , Rfl:    ciprofloxacin (CIPRO) 500 MG tablet, Take 500 mg by mouth 2 (two) times daily., Disp: , Rfl:    cyclobenzaprine (FLEXERIL) 10 MG tablet, Take 1 tablet (10 mg total) by mouth 3 (three) times daily as needed for muscle spasms. (Patient not taking: Reported on 06/17/2022), Disp: 30 tablet, Rfl: 0   metroNIDAZOLE (FLAGYL) 500 MG tablet, Take 500 mg by mouth 3 (three) times daily., Disp: , Rfl:   Physical exam:  Vitals:   06/17/22 1311  BP: 94/67  Pulse: (!) 58  Resp: 16  Temp: (!) 97.1 F (36.2 C)  TempSrc: Tympanic  SpO2: 100%  Weight: 155  lb (70.3 kg)   Physical Exam Constitutional:      General: She is not in acute distress.    Comments: Accompanied by daughter.   Cardiovascular:     Rate and Rhythm: Normal rate and regular rhythm.     Heart sounds: Normal heart sounds.  Pulmonary:     Effort: Pulmonary effort is normal.     Breath sounds: Normal breath sounds.  Skin:    General: Skin is warm and dry.     Coloration: Skin is pale.     Findings: No bruising.  Neurological:     Mental Status: She is alert and oriented to person, place, and time.  Psychiatric:        Mood and Affect: Mood normal.        Behavior: Behavior normal.         Latest Ref Rng & Units 04/22/2022   10:56 AM  CMP  Glucose 70 - 99 mg/dL 92   BUN 8 - 23 mg/dL 18   Creatinine 0.44 - 1.00 mg/dL 1.14   Sodium 135 - 145 mmol/L 139   Potassium 3.5 - 5.1 mmol/L 3.9   Chloride 98 - 111 mmol/L 104   CO2 22 - 32 mmol/L 28   Calcium 8.9 - 10.3 mg/dL 10.1       Latest Ref Rng & Units 06/17/2022    1:01 PM  CBC  WBC 4.0 - 10.5 K/uL 6.1   Hemoglobin 12.0 - 15.0 g/dL 8.6   Hematocrit 36.0 - 46.0 % 26.5   Platelets 150 - 400 K/uL 197    Iron/TIBC/Ferritin/ %Sat    Component Value Date/Time   IRON 88 06/17/2022 1301   TIBC 304 06/17/2022 1301   FERRITIN 83 06/17/2022 1301   IRONPCTSAT 29 06/17/2022 1301    Assessment and plan- Patient is a 81 y.o. female who is here for follow-up of following issues:  Normocytic anemia: Patient's baseline hemoglobin is around 11. Today, ithas dropped to 8.6. Given recent bleeding episode, I am suspicious of iron deficiency however, iron studies pending at time of visit. She has history of reaction to ferrlecit but has  tolerated venofer well. Proceed with venofer x 200 mg today. Ferritin 83, iron saturation 29%. She will not require additional IV iron.  IgG MGUS: plan to check myeloma panel when she sees Dr. Janese Banks in September.  Neutropenia: Mild and waxes and wanes.  Continue to monitor. Normal today, wbc  6.1, ANC 5.0.   Disposition: IV Venofer x 1 today RTC in 2 months for labs (cbc, ferritin, iron studies, cmp, multiple myeloma panel) and follow up with Dr. Janese Banks, +/- venofer- la   Visit Diagnosis 1. Iron deficiency anemia, unspecified iron deficiency anemia type    Beckey Rutter, DNP, AGNP-C [place] at [facility] [phone] (clinic) 06/17/2022

## 2022-07-09 ENCOUNTER — Other Ambulatory Visit: Payer: Self-pay | Admitting: Internal Medicine

## 2022-07-09 DIAGNOSIS — R1032 Left lower quadrant pain: Secondary | ICD-10-CM

## 2022-07-11 ENCOUNTER — Other Ambulatory Visit: Payer: Self-pay | Admitting: Internal Medicine

## 2022-07-11 DIAGNOSIS — Z1231 Encounter for screening mammogram for malignant neoplasm of breast: Secondary | ICD-10-CM

## 2022-07-18 ENCOUNTER — Ambulatory Visit
Admission: RE | Admit: 2022-07-18 | Discharge: 2022-07-18 | Disposition: A | Discharge status: Home / Self Care | Payer: Medicare HMO | Source: Ambulatory Visit | Attending: Internal Medicine | Admitting: Internal Medicine

## 2022-07-18 DIAGNOSIS — R1032 Left lower quadrant pain: Secondary | ICD-10-CM

## 2022-07-18 MED ORDER — IOPAMIDOL (ISOVUE-300) INJECTION 61%
100.0000 mL | Freq: Once | INTRAVENOUS | Status: AC | PRN
  Administered 2022-07-18: 100 mL via INTRAVENOUS

## 2022-08-08 ENCOUNTER — Ambulatory Visit
Admission: RE | Admit: 2022-08-08 | Discharge: 2022-08-08 | Disposition: A | Discharge status: Home / Self Care | Payer: Medicare HMO | Source: Ambulatory Visit | Attending: Internal Medicine | Admitting: Internal Medicine

## 2022-08-08 DIAGNOSIS — Z1231 Encounter for screening mammogram for malignant neoplasm of breast: Secondary | ICD-10-CM | POA: Diagnosis present

## 2022-08-29 ENCOUNTER — Other Ambulatory Visit: Payer: Self-pay

## 2022-08-29 ENCOUNTER — Other Ambulatory Visit: Payer: Self-pay | Admitting: *Deleted

## 2022-08-29 ENCOUNTER — Inpatient Hospital Stay: Payer: Medicare HMO | Attending: Oncology

## 2022-08-29 DIAGNOSIS — Z79899 Other long term (current) drug therapy: Secondary | ICD-10-CM | POA: Insufficient documentation

## 2022-08-29 DIAGNOSIS — D509 Iron deficiency anemia, unspecified: Secondary | ICD-10-CM

## 2022-08-29 LAB — COMPREHENSIVE METABOLIC PANEL
ALT: 13 U/L (ref 0–44)
AST: 22 U/L (ref 15–41)
Albumin: 3.5 g/dL (ref 3.5–5.0)
Alkaline Phosphatase: 56 U/L (ref 38–126)
Anion gap: 6 (ref 5–15)
BUN: 22 mg/dL (ref 8–23)
CO2: 27 mmol/L (ref 22–32)
Calcium: 9.2 mg/dL (ref 8.9–10.3)
Chloride: 103 mmol/L (ref 98–111)
Creatinine, Ser: 1.1 mg/dL — ABNORMAL HIGH (ref 0.44–1.00)
GFR, Estimated: 51 mL/min — ABNORMAL LOW (ref >60)
Glucose, Bld: 186 mg/dL — ABNORMAL HIGH (ref 70–99)
Potassium: 3.9 mmol/L (ref 3.5–5.1)
Sodium: 136 mmol/L (ref 135–145)
Total Bilirubin: 0.5 mg/dL (ref 0.3–1.2)
Total Protein: 7.8 g/dL (ref 6.5–8.1)

## 2022-08-29 LAB — CBC WITH DIFFERENTIAL/PLATELET
Abs Immature Granulocytes: 0.01 10*3/uL (ref 0.00–0.07)
Basophils Absolute: 0 10*3/uL (ref 0.0–0.1)
Basophils Relative: 0 %
Eosinophils Absolute: 0 10*3/uL (ref 0.0–0.5)
Eosinophils Relative: 0 %
HCT: 35.9 % — ABNORMAL LOW (ref 36.0–46.0)
Hemoglobin: 11.6 g/dL — ABNORMAL LOW (ref 12.0–15.0)
Immature Granulocytes: 0 %
Lymphocytes Relative: 11 %
Lymphs Abs: 0.5 10*3/uL — ABNORMAL LOW (ref 0.7–4.0)
MCH: 30 pg (ref 26.0–34.0)
MCHC: 32.3 g/dL (ref 30.0–36.0)
MCV: 92.8 fL (ref 80.0–100.0)
Monocytes Absolute: 0.2 10*3/uL (ref 0.1–1.0)
Monocytes Relative: 5 %
Neutro Abs: 3.5 10*3/uL (ref 1.7–7.7)
Neutrophils Relative %: 84 %
Platelets: 214 10*3/uL (ref 150–400)
RBC: 3.87 MIL/uL (ref 3.87–5.11)
RDW: 15.3 % (ref 11.5–15.5)
WBC: 4.2 10*3/uL (ref 4.0–10.5)
nRBC: 0 % (ref 0.0–0.2)

## 2022-08-29 LAB — IRON AND TIBC
Iron: 44 ug/dL (ref 28–170)
Saturation Ratios: 12 % (ref 10.4–31.8)
TIBC: 354 ug/dL (ref 250–450)
UIBC: 310 ug/dL

## 2022-08-29 LAB — FERRITIN: Ferritin: 27 ng/mL (ref 11–307)

## 2022-08-29 NOTE — Telephone Encounter (Signed)
Error

## 2022-09-02 MED FILL — Iron Sucrose Inj 20 MG/ML (Fe Equiv): INTRAVENOUS | Qty: 10 | Status: AC

## 2022-09-03 ENCOUNTER — Encounter: Payer: Self-pay | Admitting: Oncology

## 2022-09-03 ENCOUNTER — Inpatient Hospital Stay: Payer: Medicare HMO

## 2022-09-03 ENCOUNTER — Inpatient Hospital Stay: Payer: Medicare HMO | Admitting: Oncology

## 2022-09-03 VITALS — BP 136/76 | HR 60 | Temp 97.9°F | Resp 16 | Wt 156.0 lb

## 2022-09-03 DIAGNOSIS — D509 Iron deficiency anemia, unspecified: Secondary | ICD-10-CM

## 2022-09-03 DIAGNOSIS — D472 Monoclonal gammopathy: Secondary | ICD-10-CM

## 2022-09-03 MED ORDER — SODIUM CHLORIDE 0.9 % IV SOLN
Freq: Once | INTRAVENOUS | Status: AC
  Filled 2022-09-03: qty 250

## 2022-09-03 MED ORDER — SODIUM CHLORIDE 0.9 % IV SOLN
200.0000 mg | INTRAVENOUS | Status: DC
  Administered 2022-09-03: 200 mg via INTRAVENOUS
  Filled 2022-09-03: qty 200

## 2022-09-03 NOTE — Progress Notes (Signed)
Hematology/Oncology Consult note [facility]  [phone] [phone]  Patient Care Team: Adin Hector, MD as PCP - General (Internal Medicine)   Name of the patient: Brittney Clark  [id number]  [birth year]   Date of visit: 09/03/22  Diagnosis-  1. 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 IgG MGUS    Chief complaint/ Reason for visit-routine follow-up of neutropenia and anemia  Heme/Onc history:  Patient is a 81 year old female with a history of autoimmune hepatitis diagnosed in 2005 who is on Imuran and sees [facility].  She was also seen recently by rheumatology for symptoms of polyarthralgia.  During that time she had some blood work done which showed abnormal SPEP and hence the patient has been referred to [place]. Most recent CBC from 05/26/2019 showed white count of 2.6, H&H of 10.9/33.5 and a platelet count of 277.  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  She also has normocytic anemia and her baseline hemoglobin runs between 10-11.  Her white count has been a little lower than her baseline of 3-4 since she came off prednisone.  Patient mainly currently reports joint pain and some fatigue   Results of blood work from 06/03/2019 were as follows: CBC showed white count of 3.4, H&H of 10.9/33 with an MCV of 89 and platelet count of 243.  ANC was 1.6.  Smear review showed occasional smudge cells and some abnormal lymphocytes.  Ferritin levels were low at 16.  Iron studies B12 folate was normal.  TSH was normal reticulocyte count was 1.3 mildly low for the degree of anemia.  Haptoglobin was normal at 150.  Multiple myeloma panel showed IgG lambda M protein of 0.3 g.  Both kappa and lambda free light chains were elevated with a normal free light chain ratio of 1.4.  Interval history-patient had an episode  of diverticular bleed which was self-limited in July 2023.  She has not had any bleeding episodes since then.  She is trying to take oral iron every day but it makes her constipated.  She reports mild fatigue.  ECOG PS- 1 Pain scale- 0   Review of systems- Review of Systems  Constitutional:  Positive for malaise/fatigue. Negative for chills, fever and weight loss.  HENT:  Negative for congestion, ear discharge and nosebleeds.   Eyes:  Negative for blurred vision.  Respiratory:  Negative for cough, hemoptysis, sputum production, shortness of breath and wheezing.   Cardiovascular:  Negative for chest pain, palpitations, orthopnea and claudication.  Gastrointestinal:  Negative for abdominal pain, blood in stool, constipation, diarrhea, heartburn, melena, nausea and vomiting.  Genitourinary:  Negative for dysuria, flank pain, frequency, hematuria and urgency.  Musculoskeletal:  Negative for back pain, joint pain and myalgias.  Skin:  Negative for rash.  Neurological:  Negative for dizziness, tingling, focal weakness, seizures, weakness and headaches.  Endo/Heme/Allergies:  Does not bruise/bleed easily.  Psychiatric/Behavioral:  Negative for depression and suicidal ideas. The patient does not have insomnia.       Allergies  Allergen Reactions   Ferrlecit [Na Ferric Gluc Cplx In Sucrose] Shortness Of Breath and Other (See Comments)    History of allergic reaction to ferric gluconate. Chest pain required ER evaluation   Amlodipine Besy-Benazepril Hcl Other (See Comments)    Hair loss   Clonidine Other (See Comments)    Hair  loss   Diltiazem Other (See Comments)    Hair loss   Hydralazine Other (See Comments)   Penicillins Rash    Has patient had a PCN reaction causing immediate rash, facial/tongue/throat swelling, SOB or lightheadedness with hypotension: No Has patient had a PCN reaction causing severe rash involving mucus membranes or skin necrosis: No Has patient had a PCN reaction  that required hospitalization No Has patient had a PCN reaction occurring within the last 10 years: No If all of the above answers are "NO", then may proceed with Cephalosporin use.      Past Medical History:  Diagnosis Date   Anemia    Arthritis    Autoimmune hepatitis ([place])    DDD (degenerative disc disease), lumbar    GERD (gastroesophageal reflux disease)    Gout    Hypertension    Immune deficiency disorder ([place])    Internal hemorrhoids    Leukopenia    Venous stasis      Past Surgical History:  Procedure Laterality Date   ABDOMINAL HYSTERECTOMY     APPENDECTOMY     BREAST BIOPSY Left 1980   neg   BREAST SURGERY     LEFT OOPHORECTOMY     MAXIMUM ACCESS (MAS)POSTERIOR LUMBAR INTERBODY FUSION (PLIF) 1 LEVEL N/A 10/27/2015   Procedure: FOR MAXIMUM ACCESS (MAS) POSTERIOR LUMBAR INTERBODY FUSION (PLIF) L5-S1 MAS with resection of synovial cyst;  Surgeon: Erline Levine, MD;  Location: [facility];  Service: Neurosurgery;  Laterality: N/A;   NECK SURGERY  2001   ROTATOR CUFF REPAIR Right 2014    Social History   Socioeconomic History   Marital status: Married    Spouse name: Not on file   Number of children: 2   Years of education: Not on file   Highest education level: Not on file  Occupational History   Not on file  Tobacco Use   Smoking status: Never   Smokeless tobacco: Never  Vaping Use   Vaping Use: Never used  Substance and Sexual Activity   Alcohol use: No   Drug use: Never   Sexual activity: Yes  Other Topics Concern   Not on file  Social History Narrative   Not on file   Social Determinants of Health   Financial Resource Strain: Not on file  Food Insecurity: Not on file  Transportation Needs: Not on file  Physical Activity: Not on file  Stress: Not on file  Social Connections: Not on file  Intimate Partner Violence: Not on file    Family History  Problem Relation Age of Onset   Breast cancer Paternal Aunt    Stroke Mother    Aneurysm  Father    Stroke Paternal Grandfather    Diabetes Neg Hx      Current Outpatient Medications:    aspirin EC 81 MG tablet, Take 81 mg by mouth daily., Disp: , Rfl:    azaTHIOprine (IMURAN) 50 MG tablet, Take 50 mg by mouth daily., Disp: , Rfl:    Calcium-Magnesium-Vitamin D (CALCIUM 1200+D3 PO), Take 1 tablet by mouth in the morning and at bedtime., Disp: , Rfl:    carvedilol (COREG) 25 MG tablet, Take 25 mg by mouth 2 (two) times daily with a meal., Disp: , Rfl:    DILT-XR 240 MG 24 hr capsule, Take 240 mg by mouth daily., Disp: , Rfl:    ezetimibe (ZETIA) 10 MG tablet, Take 10 mg by mouth daily., Disp: , Rfl:    isosorbide mononitrate (IMDUR) 120  MG 24 hr tablet, Take 120 mg by mouth daily., Disp: , Rfl:    omeprazole (PRILOSEC) 40 MG capsule, Take 40 mg by mouth 2 (two) times daily., Disp: , Rfl:    predniSONE (DELTASONE) 5 MG tablet, Take 5 mg by mouth daily., Disp: , Rfl:    vitamin B-12 (CYANOCOBALAMIN) 500 MCG tablet, Take 500 mcg by mouth daily., Disp: , Rfl:    atorvastatin (LIPITOR) 10 MG tablet, Take 10 mg by mouth daily. (Patient not taking: Reported on 09/03/2022), Disp: , Rfl:    cyclobenzaprine (FLEXERIL) 10 MG tablet, Take 1 tablet (10 mg total) by mouth 3 (three) times daily as needed for muscle spasms. (Patient not taking: Reported on 06/17/2022), Disp: 30 tablet, Rfl: 0   HYDROcodone-acetaminophen (NORCO/VICODIN) 5-325 MG tablet, Take 1-2 tablets by mouth every 4 (four) hours as needed (pain). (Patient not taking: Reported on 09/03/2022), Disp: 30 tablet, Rfl: 0 No current facility-administered medications for this visit.  Facility-Administered Medications Ordered in Other Visits:    iron sucrose (VENOFER) 200 mg in sodium chloride 0.9 % 100 mL IVPB, 200 mg, Intravenous, Weekly, Sindy Guadeloupe, MD, Stopped at 09/03/22 1405  Physical exam:  Vitals:   09/03/22 1313  BP: 136/76  Pulse: 60  Resp: 16  Temp: 97.9 F (36.6 C)  SpO2: 97%  Weight: 156 lb (70.8 kg)   Physical  Exam Cardiovascular:     Rate and Rhythm: Normal rate and regular rhythm.     Heart sounds: Normal heart sounds.  Pulmonary:     Effort: Pulmonary effort is normal.     Breath sounds: Normal breath sounds.  Abdominal:     General: Bowel sounds are normal.     Palpations: Abdomen is soft.  Skin:    General: Skin is warm and dry.  Neurological:     Mental Status: She is alert and oriented to person, place, and time.         Latest Ref Rng & Units 08/29/2022    2:53 PM  CMP  Glucose 70 - 99 mg/dL 186   BUN 8 - 23 mg/dL 22   Creatinine 0.44 - 1.00 mg/dL 1.10   Sodium 135 - 145 mmol/L 136   Potassium 3.5 - 5.1 mmol/L 3.9   Chloride 98 - 111 mmol/L 103   CO2 22 - 32 mmol/L 27   Calcium 8.9 - 10.3 mg/dL 9.2   Total Protein 6.5 - 8.1 g/dL 7.8   Total Bilirubin 0.3 - 1.2 mg/dL 0.5   Alkaline Phos 38 - 126 U/L 56   AST 15 - 41 U/L 22   ALT 0 - 44 U/L 13       Latest Ref Rng & Units 08/29/2022    2:53 PM  CBC  WBC 4.0 - 10.5 K/uL 4.2   Hemoglobin 12.0 - 15.0 g/dL 11.6   Hematocrit 36.0 - 46.0 % 35.9   Platelets 150 - 400 K/uL 214     No images are attached to the encounter.  MM 3D SCREEN BREAST BILATERAL  Result Date: 08/09/2022 CLINICAL DATA:  Screening. EXAM: DIGITAL SCREENING BILATERAL MAMMOGRAM WITH TOMOSYNTHESIS AND CAD TECHNIQUE: Bilateral screening digital craniocaudal and mediolateral oblique mammograms were obtained. Bilateral screening digital breast tomosynthesis was performed. The images were evaluated with computer-aided detection. COMPARISON:  Previous exam(s). ACR Breast Density Category c: The breast tissue is heterogeneously dense, which may obscure small masses. FINDINGS: There are no findings suspicious for malignancy. IMPRESSION: No mammographic evidence of malignancy. A result letter  of this screening mammogram will be mailed directly to the patient. RECOMMENDATION: Screening mammogram in one year. (Code:SM-B-01Y) BI-RADS CATEGORY  1: Negative. Electronically  Signed   By: Ammie Ferrier M.D.   On: 08/09/2022 11:01     Assessment and plan- Patient is a 81 y.o. female here for follow-up of following issues:  Anemia: Patient is presently mildly anemic with a hemoglobin of 11.6 but improved as compared to 8.7 in July 2023.  Iron studies are indicated of iron deficiency and she is willing to proceed with IV iron.  We will schedule her for 5 doses of Venofer.  Repeat CBC ferritin and iron studies in 3 in 6 months and I will see her back in 6 months.  MGUS: Myeloma panel is currently pending.  We will follow-up.  Neutropenia: Patient's white cell count fluctuates between 3.5-7.  Presently 4.2 with an [place] of 3.5   Visit Diagnosis 1. Iron deficiency anemia, unspecified iron deficiency anemia type      Dr. Randa Evens, MD, MPH [facility] at [facility] [phone] 09/03/2022 4:17 PM

## 2022-09-04 LAB — MULTIPLE MYELOMA PANEL, SERUM
Albumin SerPl Elph-Mcnc: 3.3 g/dL (ref 2.9–4.4)
Albumin/Glob SerPl: 0.9 (ref 0.7–1.7)
Alpha 1: 0.3 g/dL (ref 0.0–0.4)
Alpha2 Glob SerPl Elph-Mcnc: 0.8 g/dL (ref 0.4–1.0)
B-Globulin SerPl Elph-Mcnc: 1 g/dL (ref 0.7–1.3)
Gamma Glob SerPl Elph-Mcnc: 1.7 g/dL (ref 0.4–1.8)
Globulin, Total: 3.7 g/dL (ref 2.2–3.9)
IgA: 324 mg/dL (ref 64–422)
IgG (Immunoglobin G), Serum: 1953 mg/dL — ABNORMAL HIGH (ref 586–1602)
IgM (Immunoglobulin M), Srm: 46 mg/dL (ref 26–217)
M Protein SerPl Elph-Mcnc: 0.4 g/dL — ABNORMAL HIGH
Total Protein ELP: 7 g/dL (ref 6.0–8.5)

## 2022-09-10 ENCOUNTER — Inpatient Hospital Stay: Payer: Medicare HMO | Attending: Oncology

## 2022-09-10 VITALS — BP 148/92 | HR 58 | Temp 97.7°F | Resp 20

## 2022-09-10 DIAGNOSIS — D509 Iron deficiency anemia, unspecified: Secondary | ICD-10-CM | POA: Insufficient documentation

## 2022-09-10 MED ORDER — SODIUM CHLORIDE 0.9 % IV SOLN
200.0000 mg | INTRAVENOUS | Status: DC
  Administered 2022-09-10: 200 mg via INTRAVENOUS
  Filled 2022-09-10: qty 200

## 2022-09-10 MED ORDER — SODIUM CHLORIDE 0.9 % IV SOLN
Freq: Once | INTRAVENOUS | Status: AC
  Filled 2022-09-10: qty 250

## 2022-09-13 ENCOUNTER — Inpatient Hospital Stay: Payer: Medicare HMO

## 2022-09-13 VITALS — BP 123/72 | HR 56 | Temp 96.5°F | Resp 18

## 2022-09-13 DIAGNOSIS — D509 Iron deficiency anemia, unspecified: Secondary | ICD-10-CM

## 2022-09-13 MED ORDER — SODIUM CHLORIDE 0.9 % IV SOLN
200.0000 mg | INTRAVENOUS | Status: DC
  Administered 2022-09-13: 200 mg via INTRAVENOUS
  Filled 2022-09-13: qty 200

## 2022-09-13 MED ORDER — SODIUM CHLORIDE 0.9 % IV SOLN
Freq: Once | INTRAVENOUS | Status: AC
  Filled 2022-09-13: qty 250

## 2022-09-20 ENCOUNTER — Inpatient Hospital Stay: Payer: Medicare HMO

## 2022-09-20 VITALS — BP 132/81 | HR 55 | Temp 97.0°F | Resp 18

## 2022-09-20 DIAGNOSIS — D509 Iron deficiency anemia, unspecified: Secondary | ICD-10-CM | POA: Diagnosis not present

## 2022-09-20 MED ORDER — SODIUM CHLORIDE 0.9 % IV SOLN
Freq: Once | INTRAVENOUS | Status: AC
  Filled 2022-09-20: qty 250

## 2022-09-20 MED ORDER — SODIUM CHLORIDE 0.9 % IV SOLN
200.0000 mg | INTRAVENOUS | Status: DC
  Administered 2022-09-20: 200 mg via INTRAVENOUS
  Filled 2022-09-20: qty 200

## 2022-09-23 ENCOUNTER — Telehealth: Payer: Self-pay | Admitting: *Deleted

## 2022-09-23 ENCOUNTER — Telehealth: Payer: Self-pay | Admitting: Oncology

## 2022-09-23 MED FILL — Iron Sucrose Inj 20 MG/ML (Fe Equiv): INTRAVENOUS | Qty: 10 | Status: AC

## 2022-09-23 NOTE — Telephone Encounter (Signed)
Per MD patient does not need to come in tomorrow(10/17) for her 4th iron infusion after reviewing her labs done at [facility]. Patient is ok with the plan and we reviewed her other upcoming appointments.

## 2022-09-23 NOTE — Telephone Encounter (Signed)
I called the pt and spoke to her. I told her that Dr. Janese Banks says that the ferritin does go up when pt's get IV iron infusions so the ferritin level that is elevated is not interpretable because she has recently had 4 infusions of IV iron. Please disregard the high number due to the above. Pt. Feels better and makes since that the ferritin was up.

## 2022-09-23 NOTE — Telephone Encounter (Signed)
Patient called reporting that she had labs at PCP office and her Salvadore Farber is 59 She states she is scheduled for an infusion tomorrow and is asking if she needs to keep that appointment or not. Please advise She said to respond back to her via My Chart or Phone call  Ferritin Specimen:  Blood  Ref Range & Units Today  Ferritin 11 - 307 ng/mL 466 High    Resulting Agency  [facility] - [facility]  Specimen Collected: 09/23/22 08:09   Performed by: [place]: 09/23/22 14:23  Iron Specimen:  Blood  Ref Range & Units Today  Iron 28 - 170 ug/dL [facility]  Specimen Collected: 09/23/22 08:09   Performed by: [facility] Last Resulted: 09/23/22 14:23  Received From: [facility]  Result Received: 09/23/22 15:05

## 2022-09-23 NOTE — Telephone Encounter (Signed)
Appointment cancelled per Dr Janese Banks patient was informed by Bethann Humble Scheduler

## 2022-09-24 ENCOUNTER — Inpatient Hospital Stay: Payer: Medicare HMO

## 2022-10-10 ENCOUNTER — Other Ambulatory Visit: Payer: Self-pay | Admitting: Gastroenterology

## 2022-10-10 DIAGNOSIS — K754 Autoimmune hepatitis: Secondary | ICD-10-CM

## 2022-10-16 ENCOUNTER — Ambulatory Visit
Admission: RE | Admit: 2022-10-16 | Discharge: 2022-10-16 | Disposition: A | Discharge status: Home / Self Care | Payer: Medicare HMO | Source: Ambulatory Visit | Attending: Gastroenterology | Admitting: Gastroenterology

## 2022-10-16 DIAGNOSIS — K754 Autoimmune hepatitis: Secondary | ICD-10-CM | POA: Diagnosis present

## 2022-12-11 ENCOUNTER — Inpatient Hospital Stay: Payer: Medicare HMO | Attending: Oncology

## 2022-12-11 DIAGNOSIS — D509 Iron deficiency anemia, unspecified: Secondary | ICD-10-CM | POA: Insufficient documentation

## 2022-12-11 LAB — CBC WITH DIFFERENTIAL/PLATELET
Abs Immature Granulocytes: 0.02 10*3/uL (ref 0.00–0.07)
Basophils Absolute: 0 10*3/uL (ref 0.0–0.1)
Basophils Relative: 1 %
Eosinophils Absolute: 0 10*3/uL (ref 0.0–0.5)
Eosinophils Relative: 1 %
HCT: 36.2 % (ref 36.0–46.0)
Hemoglobin: 12.1 g/dL (ref 12.0–15.0)
Immature Granulocytes: 1 %
Lymphocytes Relative: 37 %
Lymphs Abs: 1.2 10*3/uL (ref 0.7–4.0)
MCH: 31.5 pg (ref 26.0–34.0)
MCHC: 33.4 g/dL (ref 30.0–36.0)
MCV: 94.3 fL (ref 80.0–100.0)
Monocytes Absolute: 0.4 10*3/uL (ref 0.1–1.0)
Monocytes Relative: 11 %
Neutro Abs: 1.7 10*3/uL (ref 1.7–7.7)
Neutrophils Relative %: 49 %
Platelets: 213 10*3/uL (ref 150–400)
RBC: 3.84 MIL/uL — ABNORMAL LOW (ref 3.87–5.11)
RDW: 16.1 % — ABNORMAL HIGH (ref 11.5–15.5)
WBC: 3.3 10*3/uL — ABNORMAL LOW (ref 4.0–10.5)
nRBC: 0 % (ref 0.0–0.2)

## 2022-12-11 LAB — FERRITIN: Ferritin: 223 ng/mL (ref 11–307)

## 2022-12-11 LAB — IRON AND TIBC
Iron: 113 ug/dL (ref 28–170)
Saturation Ratios: 39 % — ABNORMAL HIGH (ref 10.4–31.8)
TIBC: 287 ug/dL (ref 250–450)
UIBC: 174 ug/dL

## 2023-03-03 ENCOUNTER — Inpatient Hospital Stay: Payer: Medicare HMO | Attending: Oncology

## 2023-03-03 ENCOUNTER — Encounter: Payer: Self-pay | Admitting: Oncology

## 2023-03-03 ENCOUNTER — Inpatient Hospital Stay (HOSPITAL_BASED_OUTPATIENT_CLINIC_OR_DEPARTMENT_OTHER): Payer: Medicare HMO | Admitting: Oncology

## 2023-03-03 ENCOUNTER — Inpatient Hospital Stay: Payer: Medicare HMO

## 2023-03-03 VITALS — BP 118/84 | HR 66 | Temp 97.6°F | Resp 16 | Ht 65.0 in | Wt 148.3 lb

## 2023-03-03 DIAGNOSIS — D509 Iron deficiency anemia, unspecified: Secondary | ICD-10-CM

## 2023-03-03 DIAGNOSIS — I10 Essential (primary) hypertension: Secondary | ICD-10-CM | POA: Insufficient documentation

## 2023-03-03 DIAGNOSIS — K754 Autoimmune hepatitis: Secondary | ICD-10-CM | POA: Insufficient documentation

## 2023-03-03 DIAGNOSIS — D472 Monoclonal gammopathy: Secondary | ICD-10-CM

## 2023-03-03 DIAGNOSIS — Z79631 Long term (current) use of antimetabolite agent: Secondary | ICD-10-CM | POA: Diagnosis not present

## 2023-03-03 DIAGNOSIS — Z79899 Other long term (current) drug therapy: Secondary | ICD-10-CM | POA: Insufficient documentation

## 2023-03-03 DIAGNOSIS — Z7982 Long term (current) use of aspirin: Secondary | ICD-10-CM | POA: Insufficient documentation

## 2023-03-03 DIAGNOSIS — Z7952 Long term (current) use of systemic steroids: Secondary | ICD-10-CM | POA: Diagnosis not present

## 2023-03-03 DIAGNOSIS — Z9049 Acquired absence of other specified parts of digestive tract: Secondary | ICD-10-CM | POA: Diagnosis not present

## 2023-03-03 DIAGNOSIS — Z79624 Long term (current) use of inhibitors of nucleotide synthesis: Secondary | ICD-10-CM | POA: Diagnosis not present

## 2023-03-03 LAB — CBC WITH DIFFERENTIAL/PLATELET
Abs Immature Granulocytes: 0.02 10*3/uL (ref 0.00–0.07)
Basophils Absolute: 0 10*3/uL (ref 0.0–0.1)
Basophils Relative: 1 %
Eosinophils Absolute: 0 10*3/uL (ref 0.0–0.5)
Eosinophils Relative: 1 %
HCT: 26.7 % — ABNORMAL LOW (ref 36.0–46.0)
Hemoglobin: 8.5 g/dL — ABNORMAL LOW (ref 12.0–15.0)
Immature Granulocytes: 0 %
Lymphocytes Relative: 23 %
Lymphs Abs: 1.1 10*3/uL (ref 0.7–4.0)
MCH: 30.4 pg (ref 26.0–34.0)
MCHC: 31.8 g/dL (ref 30.0–36.0)
MCV: 95.4 fL (ref 80.0–100.0)
Monocytes Absolute: 0.6 10*3/uL (ref 0.1–1.0)
Monocytes Relative: 12 %
Neutro Abs: 3 10*3/uL (ref 1.7–7.7)
Neutrophils Relative %: 63 %
Platelets: 363 10*3/uL (ref 150–400)
RBC: 2.8 MIL/uL — ABNORMAL LOW (ref 3.87–5.11)
RDW: 16 % — ABNORMAL HIGH (ref 11.5–15.5)
WBC: 4.7 10*3/uL (ref 4.0–10.5)
nRBC: 0.4 % — ABNORMAL HIGH (ref 0.0–0.2)

## 2023-03-03 LAB — IRON AND TIBC
Iron: 29 ug/dL (ref 28–170)
Saturation Ratios: 9 % — ABNORMAL LOW (ref 10.4–31.8)
TIBC: 335 ug/dL (ref 250–450)
UIBC: 306 ug/dL

## 2023-03-03 LAB — FERRITIN: Ferritin: 44 ng/mL (ref 11–307)

## 2023-03-03 MED ORDER — SODIUM CHLORIDE 0.9 % IV SOLN
200.0000 mg | INTRAVENOUS | Status: DC
  Administered 2023-03-03: 200 mg via INTRAVENOUS
  Filled 2023-03-03: qty 200

## 2023-03-03 MED ORDER — SODIUM CHLORIDE 0.9 % IV SOLN
Freq: Once | INTRAVENOUS | Status: AC
  Filled 2023-03-03: qty 250

## 2023-03-03 NOTE — Progress Notes (Signed)
Hematology/Oncology Consult note [facility]  Telephone:([phone] [phone]  Patient Care Team: Adin Hector, MD as PCP - General (Internal Medicine)   Name of the patient: Brittney Clark  [id number]  Nov 19, 1941   Date of visit: 03/03/23  Diagnosis- 1. 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 IgG MGUS  Chief complaint/ Reason for visit-routine follow-up of anemia  Heme/Onc history: Patient is a 82 year old female with a history of autoimmune hepatitis diagnosed in 2005 who is on Imuran and sees [facility].  She was also seen recently by rheumatology for symptoms of polyarthralgia.  During that time she had some blood work done which showed abnormal SPEP and hence the patient has been referred to [place]. Most recent CBC from 05/26/2019 showed white count of 2.6, H&H of 10.9/33.5 and a platelet count of 277.  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  She also has normocytic anemia and her baseline hemoglobin runs between 10-11.  Her white count has been a little lower than her baseline of 3-4 since she came off prednisone.  Patient mainly currently reports joint pain and some fatigue   Results of blood work from 06/03/2019 were as follows: CBC showed white count of 3.4, H&H of 10.9/33 with an MCV of 89 and platelet count of 243.  ANC was 1.6.  Smear review showed occasional smudge cells and some abnormal lymphocytes.  Ferritin levels were low at 16.  Iron studies B12 folate was normal.  TSH was normal reticulocyte count was 1.3 mildly low for the degree of anemia.  Haptoglobin was normal at 150.  Multiple myeloma panel showed IgG lambda M protein of 0.3 g.  Both kappa and lambda free light chains were elevated with a normal free light chain ratio of 1.4.  Interval history-patient was admitted to the hospital at [facility]  about 2 weeks ago for diverticular bleed and received 4 units of blood transfusion.  She did have a colonoscopy but the bleeding had stopped by then.  She is scheduled to meet her surgeon soon to discuss management of diverticular bleed since this has been her second episode.  ECOG PS- 1 Pain scale- 0   Review of systems- Review of Systems  Constitutional:  Positive for malaise/fatigue. Negative for chills, fever and weight loss.  HENT:  Negative for congestion, ear discharge and nosebleeds.   Eyes:  Negative for blurred vision.  Respiratory:  Negative for cough, hemoptysis, sputum production, shortness of breath and wheezing.   Cardiovascular:  Negative for chest pain, palpitations, orthopnea and claudication.  Gastrointestinal:  Negative for abdominal pain, blood in stool, constipation, diarrhea, heartburn, melena, nausea and vomiting.  Genitourinary:  Negative for dysuria, flank pain, frequency, hematuria and urgency.  Musculoskeletal:  Negative for back pain, joint pain and myalgias.  Skin:  Negative for rash.  Neurological:  Negative for dizziness, tingling, focal weakness, seizures, weakness and headaches.  Endo/Heme/Allergies:  Does not bruise/bleed easily.  Psychiatric/Behavioral:  Negative for depression and suicidal ideas. The patient does not have insomnia.       Allergies  Allergen Reactions   Ferrlecit [Na Ferric Gluc Cplx In Sucrose] Shortness Of Breath and Other (See Comments)    History of allergic reaction to ferric gluconate. Chest pain required ER evaluation   Amlodipine Besy-Benazepril Hcl Other (See Comments)    Hair loss  Clonidine Other (See Comments)    Hair loss   Diltiazem Other (See Comments)    Hair loss   Hydralazine Other (See Comments)   Penicillins Rash    Has patient had a PCN reaction causing immediate rash, facial/tongue/throat swelling, SOB or lightheadedness with hypotension: No Has patient had a PCN reaction causing severe rash involving mucus  membranes or skin necrosis: No Has patient had a PCN reaction that required hospitalization No Has patient had a PCN reaction occurring within the last 10 years: No If all of the above answers are "NO", then may proceed with Cephalosporin use.      Past Medical History:  Diagnosis Date   Anemia    Arthritis    Autoimmune hepatitis ([place])    DDD (degenerative disc disease), lumbar    GERD (gastroesophageal reflux disease)    Gout    Hypertension    Immune deficiency disorder ([place])    Internal hemorrhoids    Leukopenia    Venous stasis      Past Surgical History:  Procedure Laterality Date   ABDOMINAL HYSTERECTOMY     APPENDECTOMY     BREAST BIOPSY Left 1980   neg   BREAST SURGERY     LEFT OOPHORECTOMY     MAXIMUM ACCESS (MAS)POSTERIOR LUMBAR INTERBODY FUSION (PLIF) 1 LEVEL N/A 10/27/2015   Procedure: FOR MAXIMUM ACCESS (MAS) POSTERIOR LUMBAR INTERBODY FUSION (PLIF) L5-S1 MAS with resection of synovial cyst;  Surgeon: Erline Levine, MD;  Location: [facility];  Service: Neurosurgery;  Laterality: N/A;   NECK SURGERY  2001   ROTATOR CUFF REPAIR Right 2014    Social History   Socioeconomic History   Marital status: Married    Spouse name: Not on file   Number of children: 2   Years of education: Not on file   Highest education level: Not on file  Occupational History   Not on file  Tobacco Use   Smoking status: Never   Smokeless tobacco: Never  Vaping Use   Vaping Use: Never used  Substance and Sexual Activity   Alcohol use: No   Drug use: Never   Sexual activity: Yes  Other Topics Concern   Not on file  Social History Narrative   Not on file   Social Determinants of Health   Financial Resource Strain: Not on file  Food Insecurity: Not on file  Transportation Needs: Not on file  Physical Activity: Not on file  Stress: Not on file  Social Connections: Not on file  Intimate Partner Violence: Not on file    Family History  Problem Relation Age of  Onset   Breast cancer Paternal Aunt    Stroke Mother    Aneurysm Father    Stroke Paternal Grandfather    Diabetes Neg Hx      Current Outpatient Medications:    aspirin EC 81 MG tablet, Take 81 mg by mouth daily., Disp: , Rfl:    azaTHIOprine (IMURAN) 50 MG tablet, Take 50 mg by mouth daily., Disp: , Rfl:    Calcium-Magnesium-Vitamin D (CALCIUM 1200+D3 PO), Take 1 tablet by mouth in the morning and at bedtime., Disp: , Rfl:    carvedilol (COREG) 25 MG tablet, Take 25 mg by mouth 2 (two) times daily with a meal., Disp: , Rfl:    DILT-XR 240 MG 24 hr capsule, Take 240 mg by mouth daily., Disp: , Rfl:    ezetimibe (ZETIA) 10 MG tablet, Take 10 mg by mouth daily., Disp: ,  Rfl:    isosorbide mononitrate (IMDUR) 120 MG 24 hr tablet, Take 120 mg by mouth daily., Disp: , Rfl:    omeprazole (PRILOSEC) 40 MG capsule, Take 40 mg by mouth 2 (two) times daily., Disp: , Rfl:    predniSONE (DELTASONE) 5 MG tablet, Take 5 mg by mouth daily., Disp: , Rfl:    vitamin B-12 (CYANOCOBALAMIN) 500 MCG tablet, Take 500 mcg by mouth daily., Disp: , Rfl:    cyclobenzaprine (FLEXERIL) 10 MG tablet, Take 1 tablet (10 mg total) by mouth 3 (three) times daily as needed for muscle spasms. (Patient not taking: Reported on 06/17/2022), Disp: 30 tablet, Rfl: 0   HYDROcodone-acetaminophen (NORCO/VICODIN) 5-325 MG tablet, Take 1-2 tablets by mouth every 4 (four) hours as needed (pain). (Patient not taking: Reported on 09/03/2022), Disp: 30 tablet, Rfl: 0 No current facility-administered medications for this visit.  Facility-Administered Medications Ordered in Other Visits:    iron sucrose (VENOFER) 200 mg in sodium chloride 0.9 % 100 mL IVPB, 200 mg, Intravenous, Weekly, Sindy Guadeloupe, MD, Last Rate: 440 mL/hr at 03/03/23 1145, 200 mg at 03/03/23 1145  Physical exam:  Vitals:   03/03/23 1017  BP: 118/84  Pulse: 66  Resp: 16  Temp: 97.6 F (36.4 C)  TempSrc: Tympanic  SpO2: 100%  Weight: 148 lb 4.8 oz (67.3 kg)   Height: 5\' 5"  (1.651 m)   Physical Exam Cardiovascular:     Rate and Rhythm: Normal rate and regular rhythm.     Heart sounds: Normal heart sounds.  Pulmonary:     Effort: Pulmonary effort is normal.     Breath sounds: Normal breath sounds.  Skin:    General: Skin is warm and dry.  Neurological:     Mental Status: She is alert and oriented to person, place, and time.         Latest Ref Rng & Units 08/29/2022    2:53 PM  CMP  Glucose 70 - 99 mg/dL 186   BUN 8 - 23 mg/dL 22   Creatinine 0.44 - 1.00 mg/dL 1.10   Sodium 135 - 145 mmol/L 136   Potassium 3.5 - 5.1 mmol/L 3.9   Chloride 98 - 111 mmol/L 103   CO2 22 - 32 mmol/L 27   Calcium 8.9 - 10.3 mg/dL 9.2   Total Protein 6.5 - 8.1 g/dL 7.8   Total Bilirubin 0.3 - 1.2 mg/dL 0.5   Alkaline Phos 38 - 126 U/L 56   AST 15 - 41 U/L 22   ALT 0 - 44 U/L 13       Latest Ref Rng & Units 03/03/2023   10:08 AM  CBC  WBC 4.0 - 10.5 K/uL 4.7   Hemoglobin 12.0 - 15.0 g/dL 8.5   Hematocrit 36.0 - 46.0 % 26.7   Platelets 150 - 400 K/uL 363     No images are attached to the encounter.  No results found.   Assessment and plan- Patient is a 82 y.o. female here for routine follow-up of neutropenia and iron deficiency anemia   Patient's hemoglobin is down from 8.5 from her baseline of 11 likely secondary to her recent diverticular bleed.  She did receive 4 units of PRBC transfusion in the hospital recently.  I am planning to give her 3 more doses of Venofer at this time.  CBC ferritin and iron studies in 2 4 and 6 months and I will see her back in 6 months.  Patient has a history of IgG  MGUS which has not required any treatment and I will be repeating those labs in 6 months as well.    Visit Diagnosis 1. Iron deficiency anemia, unspecified iron deficiency anemia type   2. MGUS (monoclonal gammopathy of unknown significance)      Dr. Randa Evens, MD, MPH [facility] at [facility] [id number] 03/03/2023 12:57  PM

## 2023-03-03 NOTE — Patient Instructions (Signed)

## 2023-03-03 NOTE — Progress Notes (Signed)
Patient wants a copy of Blood work sent to pcp.

## 2023-03-07 MED FILL — Iron Sucrose Inj 20 MG/ML (Fe Equiv): INTRAVENOUS | Qty: 10 | Status: AC

## 2023-03-10 ENCOUNTER — Inpatient Hospital Stay: Payer: Medicare HMO | Attending: Oncology

## 2023-03-10 VITALS — BP 108/65 | HR 62 | Temp 98.3°F | Resp 16

## 2023-03-10 DIAGNOSIS — D472 Monoclonal gammopathy: Secondary | ICD-10-CM | POA: Diagnosis present

## 2023-03-10 DIAGNOSIS — Z79899 Other long term (current) drug therapy: Secondary | ICD-10-CM | POA: Insufficient documentation

## 2023-03-10 DIAGNOSIS — D509 Iron deficiency anemia, unspecified: Secondary | ICD-10-CM | POA: Insufficient documentation

## 2023-03-10 MED ORDER — SODIUM CHLORIDE 0.9 % IV SOLN
200.0000 mg | Freq: Once | INTRAVENOUS | Status: AC
  Administered 2023-03-10: 200 mg via INTRAVENOUS
  Filled 2023-03-10: qty 200

## 2023-03-10 MED ORDER — SODIUM CHLORIDE 0.9 % IV SOLN
INTRAVENOUS | Status: DC
  Filled 2023-03-10: qty 250

## 2023-03-10 NOTE — Progress Notes (Signed)
Pt tolerated treatment without concerns.  VSS.  Pt refused 30 minute post observation period.

## 2023-03-12 ENCOUNTER — Inpatient Hospital Stay: Payer: Medicare HMO

## 2023-03-12 VITALS — BP 106/66 | HR 55 | Temp 98.1°F | Resp 16

## 2023-03-12 DIAGNOSIS — D509 Iron deficiency anemia, unspecified: Secondary | ICD-10-CM

## 2023-03-12 MED ORDER — SODIUM CHLORIDE 0.9 % IV SOLN
200.0000 mg | INTRAVENOUS | Status: DC
  Administered 2023-03-12: 200 mg via INTRAVENOUS
  Filled 2023-03-12: qty 200

## 2023-03-12 MED ORDER — SODIUM CHLORIDE 0.9 % IV SOLN
Freq: Once | INTRAVENOUS | Status: AC
  Filled 2023-03-12: qty 250

## 2023-05-06 ENCOUNTER — Inpatient Hospital Stay: Payer: Medicare HMO | Attending: Oncology

## 2023-05-06 DIAGNOSIS — D509 Iron deficiency anemia, unspecified: Secondary | ICD-10-CM | POA: Insufficient documentation

## 2023-05-06 DIAGNOSIS — D472 Monoclonal gammopathy: Secondary | ICD-10-CM | POA: Diagnosis present

## 2023-05-06 LAB — CBC
HCT: 36 % (ref 36.0–46.0)
Hemoglobin: 11.6 g/dL — ABNORMAL LOW (ref 12.0–15.0)
MCH: 29.7 pg (ref 26.0–34.0)
MCHC: 32.2 g/dL (ref 30.0–36.0)
MCV: 92.1 fL (ref 80.0–100.0)
Platelets: 228 10*3/uL (ref 150–400)
RBC: 3.91 MIL/uL (ref 3.87–5.11)
RDW: 16.3 % — ABNORMAL HIGH (ref 11.5–15.5)
WBC: 4.1 10*3/uL (ref 4.0–10.5)
nRBC: 0 % (ref 0.0–0.2)

## 2023-05-06 LAB — IRON AND TIBC
Iron: 53 ug/dL (ref 28–170)
Saturation Ratios: 14 % (ref 10.4–31.8)
TIBC: 379 ug/dL (ref 250–450)
UIBC: 326 ug/dL

## 2023-05-06 LAB — FERRITIN: Ferritin: 42 ng/mL (ref 11–307)

## 2023-05-07 LAB — KAPPA/LAMBDA LIGHT CHAINS
Kappa free light chain: 26.2 mg/L — ABNORMAL HIGH (ref 3.3–19.4)
Kappa, lambda light chain ratio: 0.86 (ref 0.26–1.65)
Lambda free light chains: 30.5 mg/L — ABNORMAL HIGH (ref 5.7–26.3)

## 2023-05-14 LAB — MULTIPLE MYELOMA PANEL, SERUM
Albumin SerPl Elph-Mcnc: 3.6 g/dL (ref 2.9–4.4)
Albumin/Glob SerPl: 1 (ref 0.7–1.7)
Alpha 1: 0.2 g/dL (ref 0.0–0.4)
Alpha2 Glob SerPl Elph-Mcnc: 0.7 g/dL (ref 0.4–1.0)
B-Globulin SerPl Elph-Mcnc: 0.9 g/dL (ref 0.7–1.3)
Gamma Glob SerPl Elph-Mcnc: 1.8 g/dL (ref 0.4–1.8)
Globulin, Total: 3.7 g/dL (ref 2.2–3.9)
IgA: 275 mg/dL (ref 64–422)
IgG (Immunoglobin G), Serum: 1810 mg/dL — ABNORMAL HIGH (ref 586–1602)
IgM (Immunoglobulin M), Srm: 32 mg/dL (ref 26–217)
M Protein SerPl Elph-Mcnc: 0.5 g/dL — ABNORMAL HIGH
Total Protein ELP: 7.3 g/dL (ref 6.0–8.5)

## 2023-05-29 IMAGING — MG MM DIGITAL SCREENING BILAT W/ TOMO AND CAD
8 series · 8 of 24 positions shown · non-contrast
Comparison: Previous exam(s).

CLINICAL DATA: Screening.

EXAM:
DIGITAL SCREENING BILATERAL MAMMOGRAM WITH TOMOSYNTHESIS AND CAD
TECHNIQUE: Bilateral screening digital craniocaudal and mediolateral oblique
mammograms were obtained. Bilateral screening digital breast
tomosynthesis was performed. The images were evaluated with
computer-aided detection.

[L CC synth-2D]
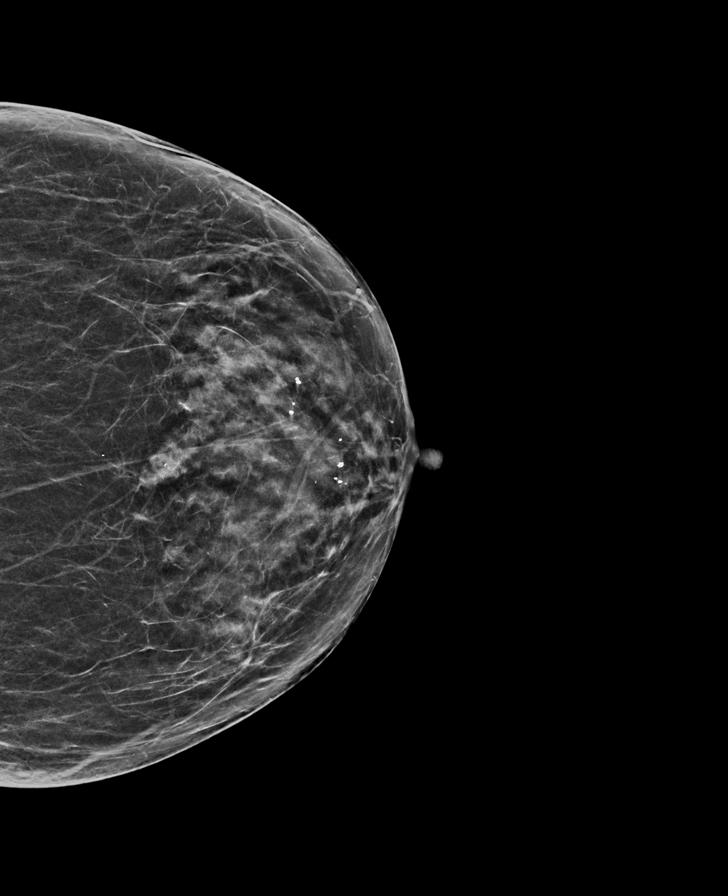

[R MLO synth-2D]
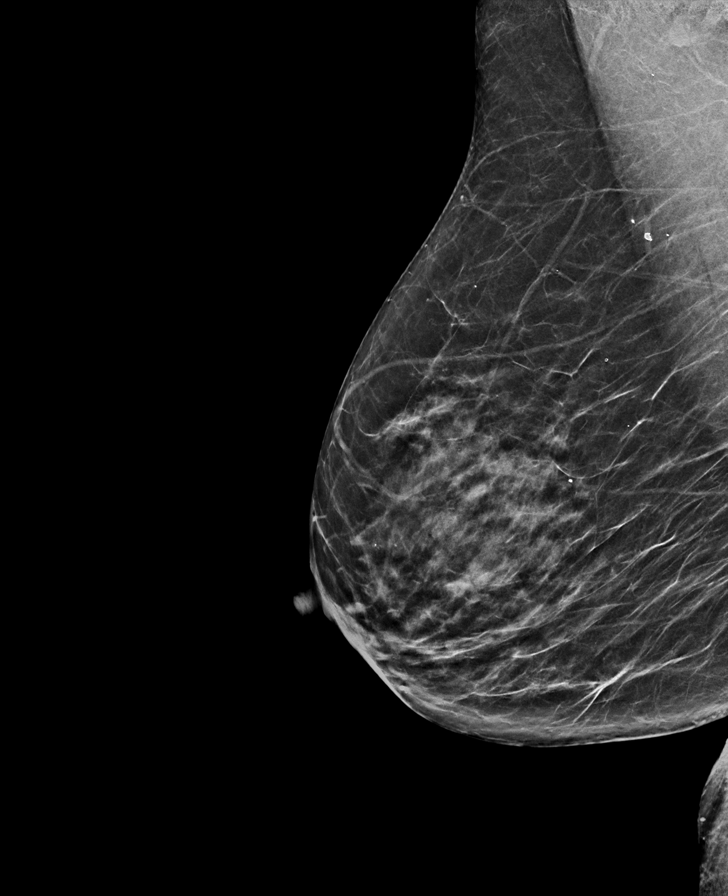

[R CC synth-2D]
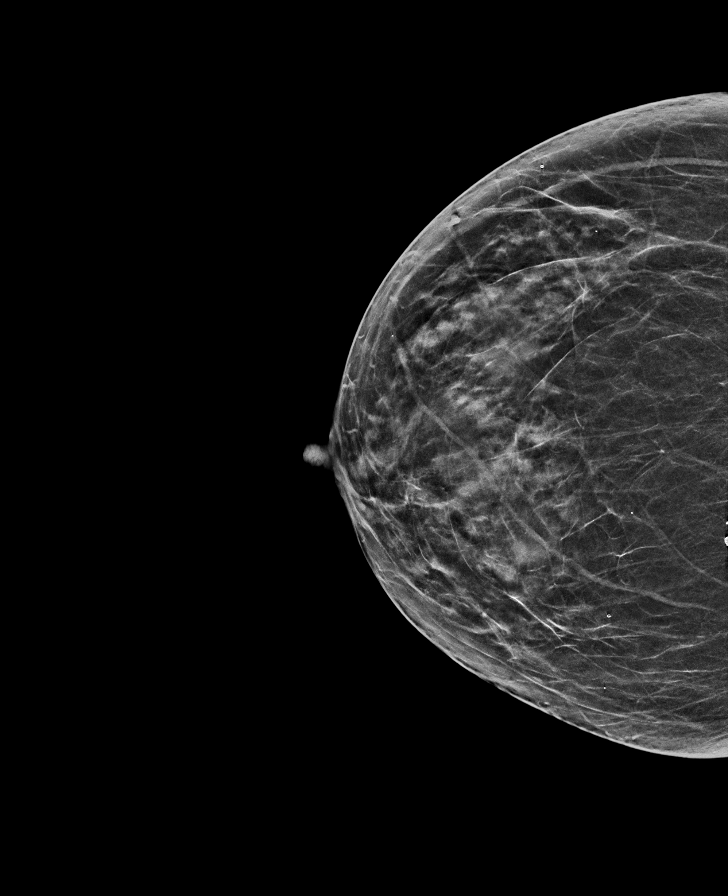

[L MLO synth-2D]
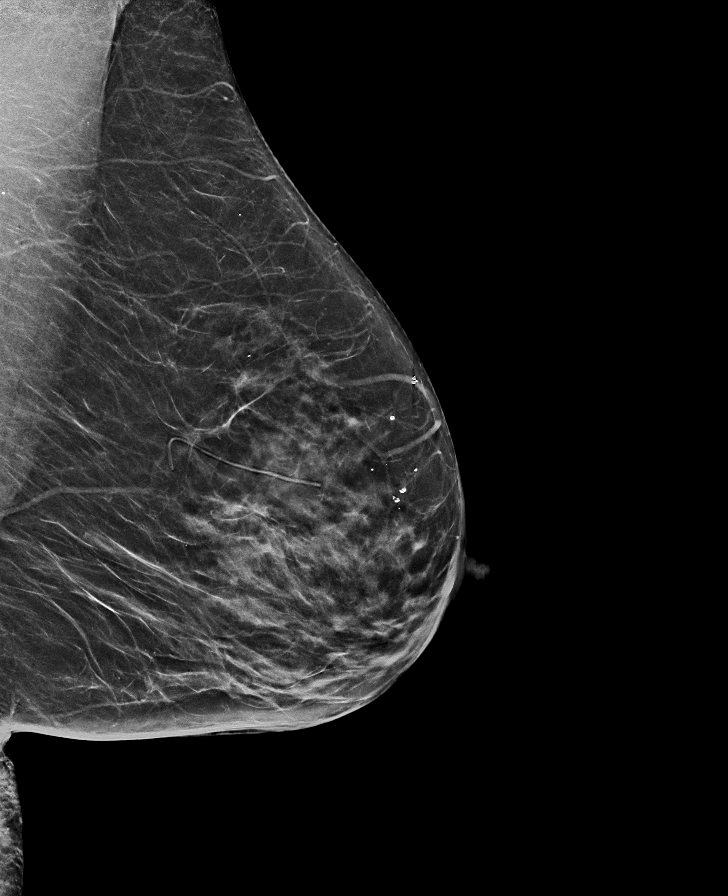

[L CC tomo · tomo slice 29/58.0]
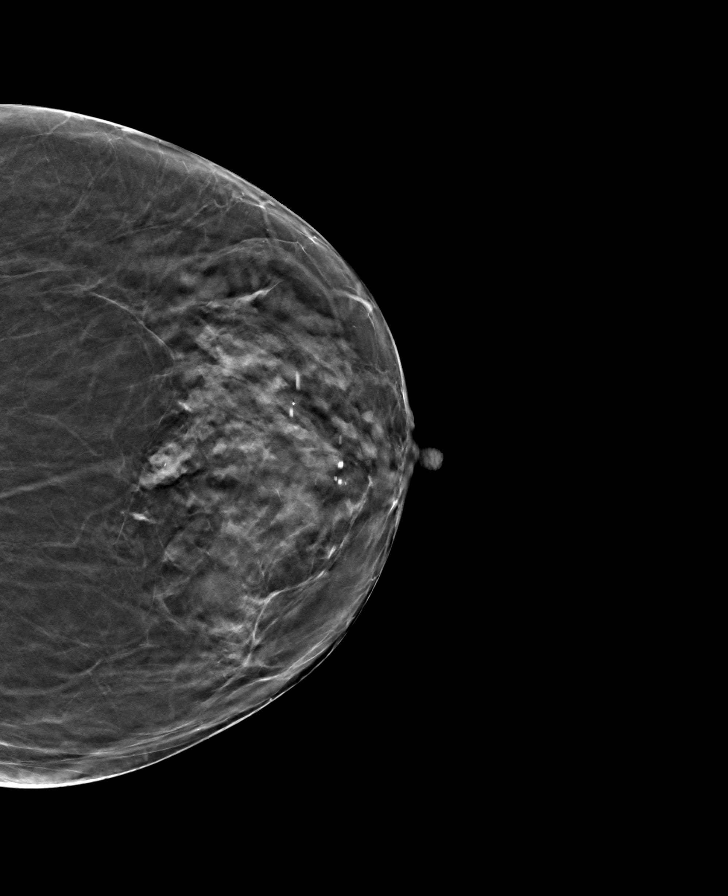

[R MLO tomo · tomo slice 29/58.0]
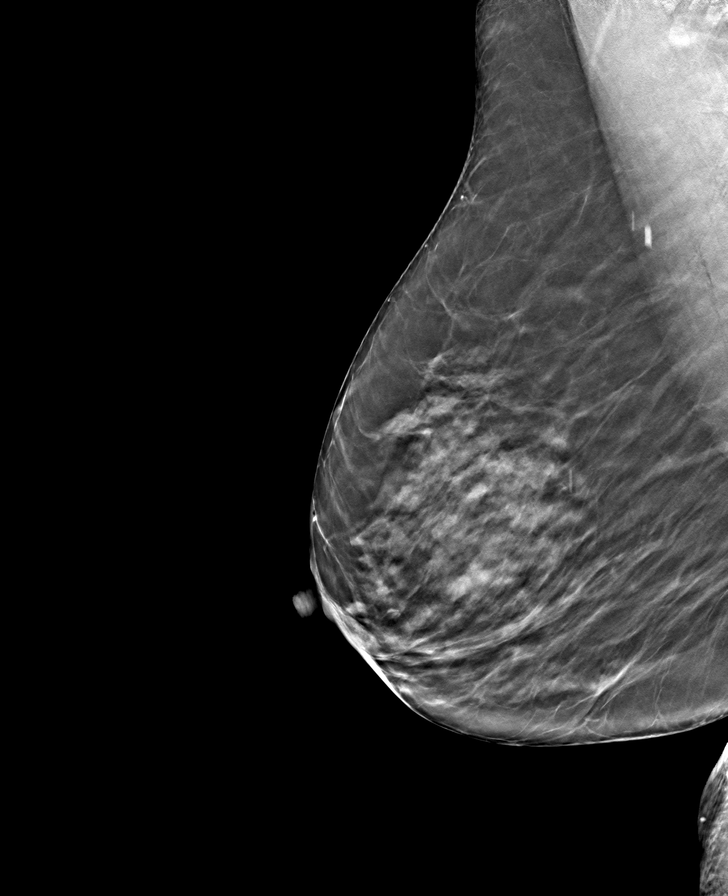

[R CC tomo · tomo slice 28/55.0]
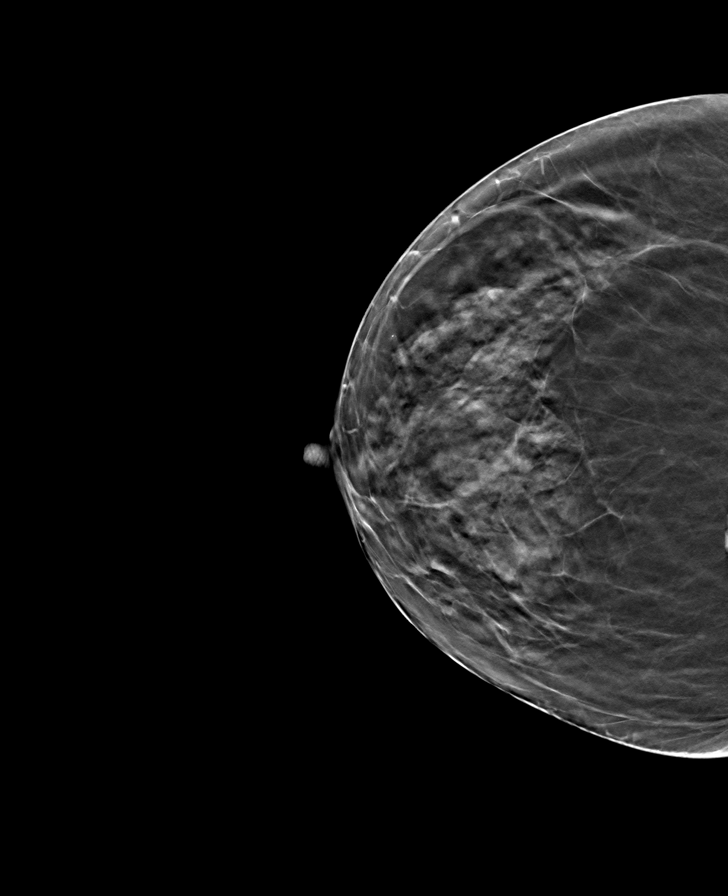

[L MLO tomo · tomo slice 31/62.0]
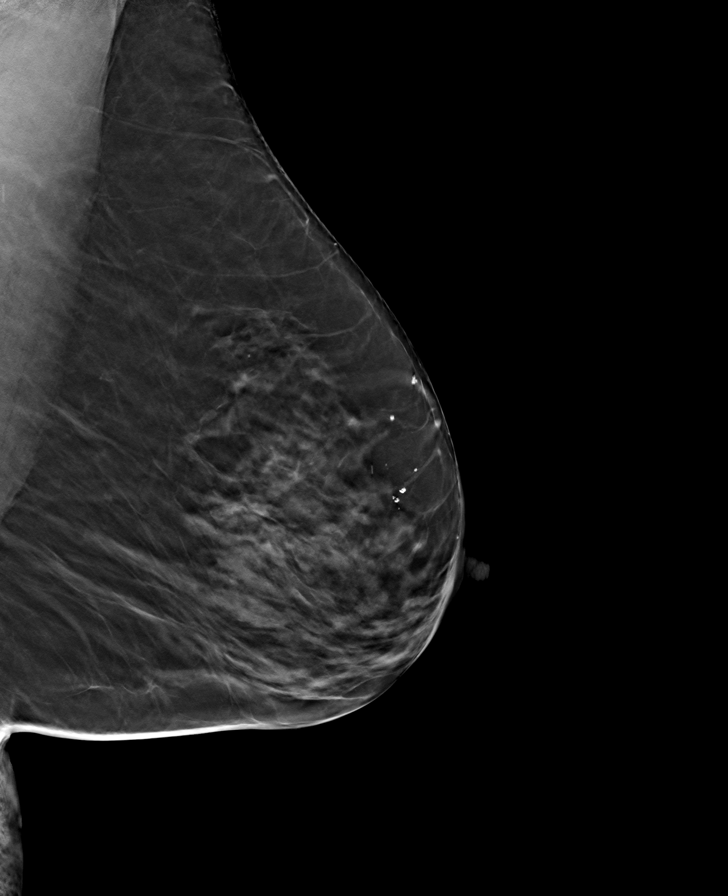

[8 of 24 positions shown; findings below may reference images not displayed]

ACR Breast Density Category c: The breast tissue is heterogeneously
dense, which may obscure small masses.
FINDINGS: There are no findings suspicious for malignancy.
IMPRESSION: No mammographic evidence of malignancy. A result letter of this
screening mammogram will be mailed directly to the patient.

RECOMMENDATION:
Screening mammogram in one year. (Code:Q3-W-BC3)

BI-RADS CATEGORY  1: Negative.

## 2023-06-30 ENCOUNTER — Encounter: Payer: Self-pay | Admitting: Gastroenterology

## 2023-07-01 ENCOUNTER — Other Ambulatory Visit: Payer: Self-pay | Admitting: Gastroenterology

## 2023-07-01 DIAGNOSIS — K754 Autoimmune hepatitis: Secondary | ICD-10-CM

## 2023-07-02 ENCOUNTER — Ambulatory Visit
Admission: RE | Admit: 2023-07-02 | Discharge: 2023-07-02 | Disposition: A | Discharge status: Home / Self Care | Payer: Medicare HMO | Source: Ambulatory Visit | Attending: Gastroenterology | Admitting: Gastroenterology

## 2023-07-02 DIAGNOSIS — K754 Autoimmune hepatitis: Secondary | ICD-10-CM | POA: Diagnosis not present

## 2023-07-03 ENCOUNTER — Inpatient Hospital Stay: Payer: Medicare HMO | Attending: Oncology

## 2023-07-03 DIAGNOSIS — D509 Iron deficiency anemia, unspecified: Secondary | ICD-10-CM | POA: Diagnosis present

## 2023-07-03 DIAGNOSIS — D472 Monoclonal gammopathy: Secondary | ICD-10-CM | POA: Insufficient documentation

## 2023-07-03 LAB — CBC
HCT: 32.7 % — ABNORMAL LOW (ref 36.0–46.0)
Hemoglobin: 10.7 g/dL — ABNORMAL LOW (ref 12.0–15.0)
MCH: 29.6 pg (ref 26.0–34.0)
MCHC: 32.7 g/dL (ref 30.0–36.0)
MCV: 90.6 fL (ref 80.0–100.0)
Platelets: 252 10*3/uL (ref 150–400)
RBC: 3.61 MIL/uL — ABNORMAL LOW (ref 3.87–5.11)
RDW: 17.6 % — ABNORMAL HIGH (ref 11.5–15.5)
WBC: 3.6 10*3/uL — ABNORMAL LOW (ref 4.0–10.5)
nRBC: 0 % (ref 0.0–0.2)

## 2023-07-03 LAB — IRON AND TIBC
Iron: 92 ug/dL (ref 28–170)
Saturation Ratios: 30 % (ref 10.4–31.8)
TIBC: 308 ug/dL (ref 250–450)
UIBC: 216 ug/dL

## 2023-07-03 LAB — FERRITIN: Ferritin: 21 ng/mL (ref 11–307)

## 2023-07-09 ENCOUNTER — Other Ambulatory Visit: Payer: Self-pay | Admitting: Internal Medicine

## 2023-07-09 DIAGNOSIS — Z1231 Encounter for screening mammogram for malignant neoplasm of breast: Secondary | ICD-10-CM

## 2023-08-12 ENCOUNTER — Ambulatory Visit
Admission: RE | Admit: 2023-08-12 | Discharge: 2023-08-12 | Disposition: A | Discharge status: Home / Self Care | Payer: Medicare HMO | Source: Ambulatory Visit | Attending: Internal Medicine | Admitting: Internal Medicine

## 2023-08-12 DIAGNOSIS — Z1231 Encounter for screening mammogram for malignant neoplasm of breast: Secondary | ICD-10-CM | POA: Diagnosis present

## 2023-09-03 ENCOUNTER — Inpatient Hospital Stay: Payer: Medicare HMO | Admitting: Oncology

## 2023-09-03 ENCOUNTER — Encounter: Payer: Self-pay | Admitting: Oncology

## 2023-09-03 ENCOUNTER — Inpatient Hospital Stay: Payer: Medicare HMO | Attending: Oncology

## 2023-09-03 ENCOUNTER — Telehealth: Payer: Self-pay | Admitting: *Deleted

## 2023-09-03 VITALS — BP 156/88 | HR 52 | Temp 96.9°F | Resp 19 | Wt 146.8 lb

## 2023-09-03 DIAGNOSIS — D709 Neutropenia, unspecified: Secondary | ICD-10-CM | POA: Diagnosis present

## 2023-09-03 DIAGNOSIS — D509 Iron deficiency anemia, unspecified: Secondary | ICD-10-CM

## 2023-09-03 DIAGNOSIS — D472 Monoclonal gammopathy: Secondary | ICD-10-CM | POA: Insufficient documentation

## 2023-09-03 DIAGNOSIS — D708 Other neutropenia: Secondary | ICD-10-CM | POA: Diagnosis not present

## 2023-09-03 LAB — CBC
HCT: 34.4 % — ABNORMAL LOW (ref 36.0–46.0)
Hemoglobin: 11.3 g/dL — ABNORMAL LOW (ref 12.0–15.0)
MCH: 30.4 pg (ref 26.0–34.0)
MCHC: 32.8 g/dL (ref 30.0–36.0)
MCV: 92.5 fL (ref 80.0–100.0)
Platelets: 211 10*3/uL (ref 150–400)
RBC: 3.72 MIL/uL — ABNORMAL LOW (ref 3.87–5.11)
RDW: 15.3 % (ref 11.5–15.5)
WBC: 3.8 10*3/uL — ABNORMAL LOW (ref 4.0–10.5)
nRBC: 0 % (ref 0.0–0.2)

## 2023-09-03 LAB — IRON AND TIBC
Iron: 81 ug/dL (ref 28–170)
Saturation Ratios: 20 % (ref 10.4–31.8)
TIBC: 414 ug/dL (ref 250–450)
UIBC: 333 ug/dL

## 2023-09-03 LAB — FERRITIN: Ferritin: 15 ng/mL (ref 11–307)

## 2023-09-03 NOTE — Progress Notes (Signed)
Hematology/Oncology Consult note [facility]  Telephone:([phone] [phone]  Patient Care Team: Lynnea Ferrier, MD as PCP - General (Internal Medicine)   Name of the patient: Brittney Clark  [id number]  1941/10/07   Date of visit: 09/03/23  Diagnosis- 1. 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 IgG MGUS  Chief complaint/ Reason for visit-routine follow-up for above issues  Heme/Onc history: Patient is a 82 year old female with a history of autoimmune hepatitis diagnosed in 2005 who is on Imuran and sees [facility].  She was also seen by sleep onset Mr. Napoleon Luellen Pucker discontinued.  SI today was specific about him today by rheumatology for symptoms of polyarthralgia.  During that time she had some blood work done which showed abnormal SPEP and hence the patient has been referred to [place]. Most recent CBC from 05/26/2019 showed white count of 2.6, H&H of 10.9/33.5 and a platelet count of 277.  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  She also has normocytic anemia and her baseline hemoglobin runs between 10-11.  Her white count has been a little lower than her baseline of 3-4 since she came off prednisone.  Patient mainly currently reports joint pain and some fatigue   Results of blood work from 06/03/2019 were as follows: CBC showed white count of 3.4, H&H of 10.9/33 with an MCV of 89 and platelet count of 243.  ANC was 1.6.  Smear review showed occasional smudge cells and some abnormal lymphocytes.  Ferritin levels were low at 16.  Iron studies B12 folate was normal.  TSH was normal reticulocyte count was 1.3 mildly low for the degree of anemia.  Haptoglobin was normal at 150.  Multiple myeloma panel showed IgG lambda M protein of 0.3 g.  Both kappa and lambda free light chains were elevated with a  normal free light chain ratio of 1.4.    Interval history-she is doing well overall for her age.  Denies any complaints at this time.  She is on azathioprine for autoimmune hepatitis  ECOG PS- 1 Pain scale- 0   Review of systems- Review of Systems  Constitutional:  Negative for chills, fever, malaise/fatigue and weight loss.  HENT:  Negative for congestion, ear discharge and nosebleeds.   Eyes:  Negative for blurred vision.  Respiratory:  Negative for cough, hemoptysis, sputum production, shortness of breath and wheezing.   Cardiovascular:  Negative for chest pain, palpitations, orthopnea and claudication.  Gastrointestinal:  Negative for abdominal pain, blood in stool, constipation, diarrhea, heartburn, melena, nausea and vomiting.  Genitourinary:  Negative for dysuria, flank pain, frequency, hematuria and urgency.  Musculoskeletal:  Negative for back pain, joint pain and myalgias.  Skin:  Negative for rash.  Neurological:  Negative for dizziness, tingling, focal weakness, seizures, weakness and headaches.  Endo/Heme/Allergies:  Does not bruise/bleed easily.  Psychiatric/Behavioral:  Negative for depression and suicidal ideas. The patient does not have insomnia.       Allergies  Allergen Reactions   Ferrlecit [Na Ferric Gluc Cplx In Sucrose] Shortness Of Breath and Other (See Comments)    History of allergic reaction to ferric gluconate. Chest pain required ER evaluation   Amlodipine Besy-Benazepril Hcl Other (See Comments)    Hair loss   Clonidine Other (See Comments)    Hair loss   Diltiazem Other (See Comments)  Hair loss   Hydralazine Other (See Comments)   Penicillins Rash    Has patient had a PCN reaction causing immediate rash, facial/tongue/throat swelling, SOB or lightheadedness with hypotension: No Has patient had a PCN reaction causing severe rash involving mucus membranes or skin necrosis: No Has patient had a PCN reaction that required hospitalization No Has  patient had a PCN reaction occurring within the last 10 years: No If all of the above answers are "NO", then may proceed with Cephalosporin use.      Past Medical History:  Diagnosis Date   Anemia    Arthritis    Autoimmune hepatitis ([facility])    DDD (degenerative disc disease), lumbar    GERD (gastroesophageal reflux disease)    Gout    Hypertension    Immune deficiency disorder ([facility])    Internal hemorrhoids    Leukopenia    Venous stasis      Past Surgical History:  Procedure Laterality Date   ABDOMINAL HYSTERECTOMY     APPENDECTOMY     BREAST BIOPSY Left 1980   neg   BREAST SURGERY     LEFT OOPHORECTOMY     MAXIMUM ACCESS (MAS)POSTERIOR LUMBAR INTERBODY FUSION (PLIF) 1 LEVEL N/A 10/27/2015   Procedure: FOR MAXIMUM ACCESS (MAS) POSTERIOR LUMBAR INTERBODY FUSION (PLIF) L5-S1 MAS with resection of synovial cyst;  Surgeon: Maeola Harman, MD;  Location: [facility];  Service: Neurosurgery;  Laterality: N/A;   NECK SURGERY  2001   ROTATOR CUFF REPAIR Right 2014    Social History   Socioeconomic History   Marital status: Married    Spouse name: Not on file   Number of children: 2   Years of education: Not on file   Highest education level: Not on file  Occupational History   Not on file  Tobacco Use   Smoking status: Never   Smokeless tobacco: Never  Vaping Use   Vaping status: Never Used  Substance and Sexual Activity   Alcohol use: No   Drug use: Never   Sexual activity: Yes  Other Topics Concern   Not on file  Social History Narrative   Not on file   Social Determinants of Health   Financial Resource Strain: Low Risk  (04/11/2023)   Received from [facility], [facility]   Overall Financial Resource Strain (CARDIA)    Difficulty of Paying Living Expenses: Not hard at all  Food Insecurity: No Food Insecurity (04/11/2023)   Received from [facility], [facility]   Hunger Vital Sign     Worried About Running Out of Food in the Last Year: Never true    Ran Out of Food in the Last Year: Never true  Transportation Needs: No Transportation Needs (04/11/2023)   Received from [facility], [facility]   [facility] - Transportation    In the past 12 months, has lack of transportation kept you from medical appointments or from getting medications?: No    Lack of Transportation (Non-Medical): No  Physical Activity: Not on file  Stress: Not on file  Social Connections: Not on file  Intimate Partner Violence: Not on file    Family History  Problem Relation Age of Onset   Breast cancer Paternal Aunt    Stroke Mother    Aneurysm Father    Stroke Paternal Grandfather    Diabetes Neg Hx      Current Outpatient Medications:    azaTHIOprine (IMURAN)  50 MG tablet, Take 50 mg by mouth daily., Disp: , Rfl:    Calcium-Magnesium-Vitamin D (CALCIUM 1200+D3 PO), Take 1 tablet by mouth in the morning and at bedtime., Disp: , Rfl:    carvedilol (COREG) 25 MG tablet, Take 25 mg by mouth 2 (two) times daily with a meal., Disp: , Rfl:    DILT-XR 240 MG 24 hr capsule, Take 240 mg by mouth daily., Disp: , Rfl:    ezetimibe (ZETIA) 10 MG tablet, Take 10 mg by mouth daily., Disp: , Rfl:    isosorbide mononitrate (IMDUR) 120 MG 24 hr tablet, Take 120 mg by mouth daily., Disp: , Rfl:    omeprazole (PRILOSEC) 40 MG capsule, Take 40 mg by mouth 2 (two) times daily., Disp: , Rfl:    predniSONE (DELTASONE) 5 MG tablet, Take 5 mg by mouth daily., Disp: , Rfl:    vitamin B-12 (CYANOCOBALAMIN) 500 MCG tablet, Take 500 mcg by mouth daily., Disp: , Rfl:    aspirin EC 81 MG tablet, Take 81 mg by mouth daily. (Patient not taking: Reported on 09/03/2023), Disp: , Rfl:    cyclobenzaprine (FLEXERIL) 10 MG tablet, Take 1 tablet (10 mg total) by mouth 3 (three) times daily as needed for muscle spasms. (Patient not taking: Reported on 06/17/2022), Disp: 30 tablet, Rfl: 0    HYDROcodone-acetaminophen (NORCO/VICODIN) 5-325 MG tablet, Take 1-2 tablets by mouth every 4 (four) hours as needed (pain). (Patient not taking: Reported on 09/03/2022), Disp: 30 tablet, Rfl: 0  Physical exam:  Vitals:   09/03/23 1112  BP: (!) 156/88  Pulse: (!) 52  Resp: 19  Temp: (!) 96.9 F (36.1 C)  SpO2: 97%  Weight: 146 lb 12.8 oz (66.6 kg)   Physical Exam Cardiovascular:     Rate and Rhythm: Normal rate and regular rhythm.     Heart sounds: Normal heart sounds.  Pulmonary:     Effort: Pulmonary effort is normal.     Breath sounds: Normal breath sounds.  Skin:    General: Skin is warm and dry.  Neurological:     Mental Status: She is alert and oriented to person, place, and time.         Latest Ref Rng & Units 08/29/2022    2:53 PM  CMP  Glucose 70 - 99 mg/dL 161   BUN 8 - 23 mg/dL 22   Creatinine 0.96 - 1.00 mg/dL 0.45   Sodium 409 - 811 mmol/L 136   Potassium 3.5 - 5.1 mmol/L 3.9   Chloride 98 - 111 mmol/L 103   CO2 22 - 32 mmol/L 27   Calcium 8.9 - 10.3 mg/dL 9.2   Total Protein 6.5 - 8.1 g/dL 7.8   Total Bilirubin 0.3 - 1.2 mg/dL 0.5   Alkaline Phos 38 - 126 U/L 56   AST 15 - 41 U/L 22   ALT 0 - 44 U/L 13       Latest Ref Rng & Units 09/03/2023   11:00 AM  CBC  WBC 4.0 - 10.5 K/uL 3.8   Hemoglobin 12.0 - 15.0 g/dL 91.4   Hematocrit 78.2 - 46.0 % 34.4   Platelets 150 - 400 K/uL 211     No images are attached to the encounter.  MM 3D SCREENING MAMMOGRAM BILATERAL BREAST  Result Date: 08/13/2023 CLINICAL DATA:  Screening. EXAM: DIGITAL SCREENING BILATERAL MAMMOGRAM WITH TOMOSYNTHESIS AND CAD TECHNIQUE: Bilateral screening digital craniocaudal and mediolateral oblique mammograms were obtained. Bilateral screening digital breast tomosynthesis was performed. The  images were evaluated with computer-aided detection. COMPARISON:  Previous exam(s). ACR Breast Density Category c: The breasts are heterogeneously dense, which may obscure small masses. FINDINGS:  There are no findings suspicious for malignancy. IMPRESSION: No mammographic evidence of malignancy. A result letter of this screening mammogram will be mailed directly to the patient. RECOMMENDATION: Screening mammogram in one year. (Code:SM-B-01Y) BI-RADS CATEGORY  1: Negative. Electronically Signed   By: Edwin Cap M.D.   On: 08/13/2023 08:43     Assessment and plan- Patient is a 82 y.o. female who is here for follow-up of following issues  Neutropenia: Possibly autoimmune versus azathioprine.  White count remained stable between 3 to 4 and neutrophils and remains more than 1.  Continue to monitor.  Iron deficiency anemia: Patient has mild anemia with a hemoglobin of 11.3.  Ferritin levels continue to remain low at 15.  These numbers came back after the patient had left the clinic and we will reach out to her to see if she would be interested in receiving IV iron.  IgG lambda MGUS: M protein has remained stable around 0.4 to 0.5 g.  I will repeat myeloma panel and serum free light chains again in 6 months and see her thereafter   Visit Diagnosis 1. Iron deficiency anemia, unspecified iron deficiency anemia type   2. MGUS (monoclonal gammopathy of unknown significance)   3. Other neutropenia ([facility])      Dr. Owens Shark, MD, MPH [facility] at [facility] [phone] 09/03/2023 1:16 PM

## 2023-09-03 NOTE — Telephone Encounter (Signed)
-----   Message from Creig Hines sent at 09/03/2023  1:03 PM EDT ----- Ferritin levels are low and we can give her more iv iron if she would like to come

## 2023-09-03 NOTE — Telephone Encounter (Signed)
Dr. Smith Robert states that her ferritin is 15 which is low. The iron is 81-ok for that. She wanted to see if the pt. Wants more IV iron. Please call me and let me know and I left the phone number to my desk.

## 2023-09-07 ENCOUNTER — Telehealth: Payer: Self-pay | Admitting: *Deleted

## 2023-09-07 NOTE — Telephone Encounter (Signed)
She wants the iv iron, she had venofer and that is what she had last time. Sent message to Aundra Millet to get the appts made and call pt.

## 2023-09-17 ENCOUNTER — Inpatient Hospital Stay: Payer: Medicare HMO | Attending: Oncology

## 2023-09-18 ENCOUNTER — Encounter: Payer: Self-pay | Admitting: Nurse Practitioner

## 2023-09-18 ENCOUNTER — Ambulatory Visit: Payer: Medicare HMO

## 2023-09-18 DIAGNOSIS — Z23 Encounter for immunization: Secondary | ICD-10-CM | POA: Diagnosis not present

## 2023-09-18 DIAGNOSIS — Z719 Counseling, unspecified: Secondary | ICD-10-CM

## 2023-09-18 NOTE — Progress Notes (Signed)
In nurse clinic for Pfizer Comirnaty [phone] (30yrs+) vaccine which was given and tolerated well. Updated [facility] copy given and explained. Declined to stay for 15 min observation. Jerel Shepherd, RN

## 2023-09-19 ENCOUNTER — Inpatient Hospital Stay: Payer: Medicare HMO | Attending: Oncology

## 2023-09-19 VITALS — BP 161/94 | HR 62 | Temp 97.8°F

## 2023-09-19 DIAGNOSIS — D509 Iron deficiency anemia, unspecified: Secondary | ICD-10-CM

## 2023-09-19 MED ORDER — SODIUM CHLORIDE 0.9 % IV SOLN
200.0000 mg | INTRAVENOUS | Status: DC
  Administered 2023-09-19: 200 mg via INTRAVENOUS
  Filled 2023-09-19: qty 200

## 2023-09-19 MED ORDER — SODIUM CHLORIDE 0.9 % IV SOLN
INTRAVENOUS | Status: DC
  Filled 2023-09-19: qty 250

## 2023-09-19 NOTE — Patient Instructions (Signed)
Iron Sucrose Injection What is this medication? IRON SUCROSE (EYE ern SOO krose) treats low levels of iron (iron deficiency anemia) in people with kidney disease. Iron is a mineral that plays an important role in making red blood cells, which carry oxygen from your lungs to the rest of your body. This medicine may be used for other purposes; ask your health care provider or pharmacist if you have questions. COMMON BRAND NAME(S): Venofer What should I tell my care team before I take this medication? They need to know if you have any of these conditions: Anemia not caused by low iron levels Heart disease High levels of iron in the blood Kidney disease Liver disease An unusual or allergic reaction to iron, other medications, foods, dyes, or preservatives Pregnant or trying to get pregnant Breastfeeding How should I use this medication? This medication is for infusion into a vein. It is given in a hospital or clinic setting. Talk to your care team about the use of this medication in children. While this medication may be prescribed for children as young as 2 years for selected conditions, precautions do apply. Overdosage: If you think you have taken too much of this medicine contact a poison control center or emergency room at once. NOTE: This medicine is only for you. Do not share this medicine with others. What if I miss a dose? Keep appointments for follow-up doses. It is important not to miss your dose. Call your care team if you are unable to keep an appointment. What may interact with this medication? Do not take this medication with any of the following: Deferoxamine Dimercaprol Other iron products This medication may also interact with the following: Chloramphenicol Deferasirox This list may not describe all possible interactions. Give your health care provider a list of all the medicines, herbs, non-prescription drugs, or dietary supplements you use. Also tell them if you smoke,  drink alcohol, or use illegal drugs. Some items may interact with your medicine. What should I watch for while using this medication? Visit your care team regularly. Tell your care team if your symptoms do not start to get better or if they get worse. You may need blood work done while you are taking this medication. You may need to follow a special diet. Talk to your care team. Foods that contain iron include: whole grains/cereals, dried fruits, beans, or peas, leafy green vegetables, and organ meats (liver, kidney). What side effects may I notice from receiving this medication? Side effects that you should report to your care team as soon as possible: Allergic reactions--skin rash, itching, hives, swelling of the face, lips, tongue, or throat Low blood pressure--dizziness, feeling faint or lightheaded, blurry vision Shortness of breath Side effects that usually do not require medical attention (report to your care team if they continue or are bothersome): Flushing Headache Joint pain Muscle pain Nausea Pain, redness, or irritation at injection site This list may not describe all possible side effects. Call your doctor for medical advice about side effects. You may report side effects to FDA at 1-800-FDA-1088. Where should I keep my medication? This medication is given in a hospital or clinic. It will not be stored at home. NOTE: This sheet is a summary. It may not cover all possible information. If you have questions about this medicine, talk to your doctor, pharmacist, or health care provider.  2024 Elsevier/Gold Standard (2023-05-02 00:00:00)

## 2023-09-22 ENCOUNTER — Inpatient Hospital Stay: Payer: Medicare HMO

## 2023-09-24 ENCOUNTER — Inpatient Hospital Stay: Payer: Medicare HMO

## 2023-09-25 ENCOUNTER — Inpatient Hospital Stay: Payer: Medicare HMO

## 2023-09-25 VITALS — BP 126/73 | HR 56 | Temp 98.0°F | Resp 18

## 2023-09-25 DIAGNOSIS — D509 Iron deficiency anemia, unspecified: Secondary | ICD-10-CM | POA: Diagnosis not present

## 2023-09-25 MED ORDER — SODIUM CHLORIDE 0.9 % IV SOLN
200.0000 mg | INTRAVENOUS | Status: DC
  Administered 2023-09-25: 200 mg via INTRAVENOUS
  Filled 2023-09-25: qty 200

## 2023-09-25 MED ORDER — SODIUM CHLORIDE 0.9 % IV SOLN
Freq: Once | INTRAVENOUS | Status: AC
  Filled 2023-09-25: qty 250

## 2023-09-29 ENCOUNTER — Inpatient Hospital Stay: Payer: Medicare HMO

## 2023-09-29 VITALS — BP 154/92 | HR 52 | Temp 98.2°F | Resp 16

## 2023-09-29 DIAGNOSIS — D509 Iron deficiency anemia, unspecified: Secondary | ICD-10-CM

## 2023-09-29 MED ORDER — SODIUM CHLORIDE 0.9 % IV SOLN
INTRAVENOUS | Status: DC
  Filled 2023-09-29: qty 250

## 2023-09-29 MED ORDER — SODIUM CHLORIDE 0.9 % IV SOLN
200.0000 mg | INTRAVENOUS | Status: DC
  Administered 2023-09-29: 200 mg via INTRAVENOUS
  Filled 2023-09-29: qty 200

## 2023-09-29 NOTE — Patient Instructions (Signed)
Iron Sucrose Injection What is this medication? IRON SUCROSE (EYE ern SOO krose) treats low levels of iron (iron deficiency anemia) in people with kidney disease. Iron is a mineral that plays an important role in making red blood cells, which carry oxygen from your lungs to the rest of your body. This medicine may be used for other purposes; ask your health care provider or pharmacist if you have questions. COMMON BRAND NAME(S): Venofer What should I tell my care team before I take this medication? They need to know if you have any of these conditions: Anemia not caused by low iron levels Heart disease High levels of iron in the blood Kidney disease Liver disease An unusual or allergic reaction to iron, other medications, foods, dyes, or preservatives Pregnant or trying to get pregnant Breastfeeding How should I use this medication? This medication is for infusion into a vein. It is given in a hospital or clinic setting. Talk to your care team about the use of this medication in children. While this medication may be prescribed for children as young as 2 years for selected conditions, precautions do apply. Overdosage: If you think you have taken too much of this medicine contact a poison control center or emergency room at once. NOTE: This medicine is only for you. Do not share this medicine with others. What if I miss a dose? Keep appointments for follow-up doses. It is important not to miss your dose. Call your care team if you are unable to keep an appointment. What may interact with this medication? Do not take this medication with any of the following: Deferoxamine Dimercaprol Other iron products This medication may also interact with the following: Chloramphenicol Deferasirox This list may not describe all possible interactions. Give your health care provider a list of all the medicines, herbs, non-prescription drugs, or dietary supplements you use. Also tell them if you smoke,  drink alcohol, or use illegal drugs. Some items may interact with your medicine. What should I watch for while using this medication? Visit your care team regularly. Tell your care team if your symptoms do not start to get better or if they get worse. You may need blood work done while you are taking this medication. You may need to follow a special diet. Talk to your care team. Foods that contain iron include: whole grains/cereals, dried fruits, beans, or peas, leafy green vegetables, and organ meats (liver, kidney). What side effects may I notice from receiving this medication? Side effects that you should report to your care team as soon as possible: Allergic reactions--skin rash, itching, hives, swelling of the face, lips, tongue, or throat Low blood pressure--dizziness, feeling faint or lightheaded, blurry vision Shortness of breath Side effects that usually do not require medical attention (report to your care team if they continue or are bothersome): Flushing Headache Joint pain Muscle pain Nausea Pain, redness, or irritation at injection site This list may not describe all possible side effects. Call your doctor for medical advice about side effects. You may report side effects to FDA at 1-800-FDA-1088. Where should I keep my medication? This medication is given in a hospital or clinic. It will not be stored at home. NOTE: This sheet is a summary. It may not cover all possible information. If you have questions about this medicine, talk to your doctor, pharmacist, or health care provider.  2024 Elsevier/Gold Standard (2023-05-02 00:00:00)

## 2023-09-29 NOTE — Progress Notes (Signed)
Declined post-observation. Aware of risks. Vitals stable at discharge.

## 2023-12-01 ENCOUNTER — Other Ambulatory Visit: Payer: Medicare HMO

## 2023-12-01 ENCOUNTER — Inpatient Hospital Stay: Payer: Medicare HMO | Attending: Oncology

## 2023-12-01 DIAGNOSIS — D472 Monoclonal gammopathy: Secondary | ICD-10-CM

## 2023-12-01 DIAGNOSIS — D509 Iron deficiency anemia, unspecified: Secondary | ICD-10-CM | POA: Diagnosis present

## 2023-12-01 LAB — CBC WITH DIFFERENTIAL/PLATELET
Abs Immature Granulocytes: 0.02 10*3/uL (ref 0.00–0.07)
Basophils Absolute: 0 10*3/uL (ref 0.0–0.1)
Basophils Relative: 1 %
Eosinophils Absolute: 0 10*3/uL (ref 0.0–0.5)
Eosinophils Relative: 0 %
HCT: 32.2 % — ABNORMAL LOW (ref 36.0–46.0)
Hemoglobin: 10.7 g/dL — ABNORMAL LOW (ref 12.0–15.0)
Immature Granulocytes: 1 %
Lymphocytes Relative: 34 %
Lymphs Abs: 1.2 10*3/uL (ref 0.7–4.0)
MCH: 31.9 pg (ref 26.0–34.0)
MCHC: 33.2 g/dL (ref 30.0–36.0)
MCV: 96.1 fL (ref 80.0–100.0)
Monocytes Absolute: 0.5 10*3/uL (ref 0.1–1.0)
Monocytes Relative: 14 %
Neutro Abs: 1.8 10*3/uL (ref 1.7–7.7)
Neutrophils Relative %: 50 %
Platelets: 211 10*3/uL (ref 150–400)
RBC: 3.35 MIL/uL — ABNORMAL LOW (ref 3.87–5.11)
RDW: 16.6 % — ABNORMAL HIGH (ref 11.5–15.5)
WBC: 3.6 10*3/uL — ABNORMAL LOW (ref 4.0–10.5)
nRBC: 0 % (ref 0.0–0.2)

## 2023-12-01 LAB — IRON AND TIBC
Iron: 105 ug/dL (ref 28–170)
Saturation Ratios: 35 % — ABNORMAL HIGH (ref 10.4–31.8)
TIBC: 302 ug/dL (ref 250–450)
UIBC: 197 ug/dL

## 2023-12-01 LAB — FERRITIN: Ferritin: 173 ng/mL (ref 11–307)

## 2023-12-11 ENCOUNTER — Encounter: Payer: Self-pay | Admitting: Oncology

## 2023-12-26 ENCOUNTER — Other Ambulatory Visit: Payer: Self-pay | Admitting: Gastroenterology

## 2023-12-26 DIAGNOSIS — K754 Autoimmune hepatitis: Secondary | ICD-10-CM

## 2024-03-02 ENCOUNTER — Other Ambulatory Visit: Payer: Self-pay

## 2024-03-02 DIAGNOSIS — D509 Iron deficiency anemia, unspecified: Secondary | ICD-10-CM

## 2024-03-02 DIAGNOSIS — D472 Monoclonal gammopathy: Secondary | ICD-10-CM

## 2024-03-03 ENCOUNTER — Inpatient Hospital Stay: Payer: Medicare HMO | Admitting: Oncology

## 2024-03-03 ENCOUNTER — Inpatient Hospital Stay: Payer: Medicare HMO | Attending: Oncology

## 2024-03-03 ENCOUNTER — Other Ambulatory Visit: Payer: Medicare HMO

## 2024-03-03 ENCOUNTER — Ambulatory Visit: Payer: Medicare HMO | Admitting: Oncology

## 2024-03-03 VITALS — BP 144/100 | HR 58 | Temp 96.0°F | Resp 18 | Ht 65.0 in | Wt 148.8 lb

## 2024-03-03 DIAGNOSIS — D509 Iron deficiency anemia, unspecified: Secondary | ICD-10-CM | POA: Insufficient documentation

## 2024-03-03 DIAGNOSIS — Z79899 Other long term (current) drug therapy: Secondary | ICD-10-CM | POA: Insufficient documentation

## 2024-03-03 DIAGNOSIS — D709 Neutropenia, unspecified: Secondary | ICD-10-CM

## 2024-03-03 DIAGNOSIS — D472 Monoclonal gammopathy: Secondary | ICD-10-CM

## 2024-03-03 LAB — CBC WITH DIFFERENTIAL (CANCER CENTER ONLY)
Abs Immature Granulocytes: 0.02 10*3/uL (ref 0.00–0.07)
Basophils Absolute: 0 10*3/uL (ref 0.0–0.1)
Basophils Relative: 1 %
Eosinophils Absolute: 0 10*3/uL (ref 0.0–0.5)
Eosinophils Relative: 1 %
HCT: 35.5 % — ABNORMAL LOW (ref 36.0–46.0)
Hemoglobin: 11.9 g/dL — ABNORMAL LOW (ref 12.0–15.0)
Immature Granulocytes: 1 %
Lymphocytes Relative: 41 %
Lymphs Abs: 1.1 10*3/uL (ref 0.7–4.0)
MCH: 32.5 pg (ref 26.0–34.0)
MCHC: 33.5 g/dL (ref 30.0–36.0)
MCV: 97 fL (ref 80.0–100.0)
Monocytes Absolute: 0.4 10*3/uL (ref 0.1–1.0)
Monocytes Relative: 16 %
Neutro Abs: 1 10*3/uL — ABNORMAL LOW (ref 1.7–7.7)
Neutrophils Relative %: 40 %
Platelet Count: 204 10*3/uL (ref 150–400)
RBC: 3.66 MIL/uL — ABNORMAL LOW (ref 3.87–5.11)
RDW: 15.2 % (ref 11.5–15.5)
WBC Count: 2.6 10*3/uL — ABNORMAL LOW (ref 4.0–10.5)
nRBC: 0 % (ref 0.0–0.2)

## 2024-03-03 LAB — IRON AND TIBC
Iron: 109 ug/dL (ref 28–170)
Saturation Ratios: 35 % — ABNORMAL HIGH (ref 10.4–31.8)
TIBC: 314 ug/dL (ref 250–450)
UIBC: 205 ug/dL

## 2024-03-03 LAB — FERRITIN: Ferritin: 178 ng/mL (ref 11–307)

## 2024-03-03 NOTE — Progress Notes (Signed)
 Hematology/Oncology Consult note [facility]  [phone] [phone]  Patient Care Team: Lynnea Ferrier, MD as PCP - General (Internal Medicine)   Name of the patient: Gracy Ehly  [id number]  02-May-1941   Date of visit: 03/03/24  Diagnosis- 1. 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 IgG MGUS  Chief complaint/ Reason for visit-routine follow-up for above issues  Heme/Onc history: Patient is a 83 year old female with a history of autoimmune hepatitis diagnosed in 2005 who is on Imuran and sees [facility].  She was also seen by sleep onset Mr. Napoleon Luellen Pucker discontinued.  SI today was specific about him today by rheumatology for symptoms of polyarthralgia.  During that time she had some blood work done which showed abnormal SPEP and hence the patient has been referred to [place]. Most recent CBC from 05/26/2019 showed white count of 2.6, H&H of 10.9/33.5 and a platelet count of 277.  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  She also has normocytic anemia and her baseline hemoglobin runs between 10-11.  Her white count has been a little lower than her baseline of 3-4 since she came off prednisone.  Patient mainly currently reports joint pain and some fatigue   Results of blood work from 06/03/2019 were as follows: CBC showed white count of 3.4, H&H of 10.9/33 with an MCV of 89 and platelet count of 243.  ANC was 1.6.  Smear review showed occasional smudge cells and some abnormal lymphocytes.  Ferritin levels were low at 16.  Iron studies B12 folate was normal.  TSH was normal reticulocyte count was 1.3 mildly low for the degree of anemia.  Haptoglobin was normal at 150.  Multiple myeloma panel showed IgG lambda M protein of 0.3 g.  Both kappa and lambda free light chains were elevated with a  normal free light chain ratio of 1.4.    Interval history-overall patient is doing well and has not had any recent hospitalizations.  She is on Imuran for autoimmune hepatitis  ECOG PS- 1 Pain scale- 0   Review of systems- Review of Systems  Constitutional:  Negative for chills, fever, malaise/fatigue and weight loss.  HENT:  Negative for congestion, ear discharge and nosebleeds.   Eyes:  Negative for blurred vision.  Respiratory:  Negative for cough, hemoptysis, sputum production, shortness of breath and wheezing.   Cardiovascular:  Negative for chest pain, palpitations, orthopnea and claudication.  Gastrointestinal:  Negative for abdominal pain, blood in stool, constipation, diarrhea, heartburn, melena, nausea and vomiting.  Genitourinary:  Negative for dysuria, flank pain, frequency, hematuria and urgency.  Musculoskeletal:  Negative for back pain, joint pain and myalgias.  Skin:  Negative for rash.  Neurological:  Negative for dizziness, tingling, focal weakness, seizures, weakness and headaches.  Endo/Heme/Allergies:  Does not bruise/bleed easily.  Psychiatric/Behavioral:  Negative for depression and suicidal ideas. The patient does not have insomnia.       Allergies  Allergen Reactions   Ferrlecit [Na Ferric Gluc Cplx In Sucrose] Shortness Of Breath and Other (See Comments)    History of allergic reaction to ferric gluconate. Chest pain required ER evaluation   Amlodipine Besy-Benazepril Hcl Other (See Comments)    Hair loss   Atorvastatin Other (See Comments)    Hair loss   Clonidine Other (See Comments)    Hair loss  Diltiazem Other (See Comments)    Hair loss   Hydralazine Other (See Comments)   Penicillins Rash    Has patient had a PCN reaction causing immediate rash, facial/tongue/throat swelling, SOB or lightheadedness with hypotension: No Has patient had a PCN reaction causing severe rash involving mucus membranes or skin necrosis: No Has patient had a PCN  reaction that required hospitalization No Has patient had a PCN reaction occurring within the last 10 years: No If all of the above answers are "NO", then may proceed with Cephalosporin use.      Past Medical History:  Diagnosis Date   Anemia    Arthritis    Autoimmune hepatitis (HCC)    DDD (degenerative disc disease), lumbar    GERD (gastroesophageal reflux disease)    Gout    Hypertension    Immune deficiency disorder (HCC)    Internal hemorrhoids    Leukopenia    Venous stasis      Past Surgical History:  Procedure Laterality Date   ABDOMINAL HYSTERECTOMY     APPENDECTOMY     BREAST BIOPSY Left 1980   neg   BREAST SURGERY     LEFT OOPHORECTOMY     MAXIMUM ACCESS (MAS)POSTERIOR LUMBAR INTERBODY FUSION (PLIF) 1 LEVEL N/A 10/27/2015   Procedure: FOR MAXIMUM ACCESS (MAS) POSTERIOR LUMBAR INTERBODY FUSION (PLIF) L5-S1 MAS with resection of synovial cyst;  Surgeon: Maeola Harman, MD;  Location: [facility];  Service: Neurosurgery;  Laterality: N/A;   NECK SURGERY  2001   ROTATOR CUFF REPAIR Right 2014    Social History   Socioeconomic History   Marital status: Married    Spouse name: Not on file   Number of children: 2   Years of education: Not on file   Highest education level: Not on file  Occupational History   Not on file  Tobacco Use   Smoking status: Never   Smokeless tobacco: Never  Vaping Use   Vaping status: Never Used  Substance and Sexual Activity   Alcohol use: No   Drug use: Never   Sexual activity: Yes  Other Topics Concern   Not on file  Social History Narrative   Not on file   Social Drivers of Health   Financial Resource Strain: Low Risk  (10/13/2023)   Received from [facility]   Overall Financial Resource Strain (CARDIA)    Difficulty of Paying Living Expenses: Not hard at all  Food Insecurity: No Food Insecurity (10/13/2023)   Received from [facility]   Hunger Vital Sign    Worried About Running  Out of Food in the Last Year: Never true    Ran Out of Food in the Last Year: Never true  Transportation Needs: No Transportation Needs (10/13/2023)   Received from [facility] - Transportation    In the past 12 months, has lack of transportation kept you from medical appointments or from getting medications?: No    Lack of Transportation (Non-Medical): No  Physical Activity: Not on file  Stress: Not on file  Social Connections: Not on file  Intimate Partner Violence: Not on file    Family History  Problem Relation Age of Onset   Breast cancer Paternal Aunt    Stroke Mother    Aneurysm Father    Stroke Paternal Grandfather    Diabetes Neg Hx      Current Outpatient Medications:    potassium chloride (KLOR-CON M) 10 MEQ tablet,  Take by mouth., Disp: , Rfl:    triamterene-hydrochlorothiazide (MAXZIDE) 75-50 MG tablet, Take 1 tablet by mouth daily., Disp: , Rfl:    aspirin EC 81 MG tablet, Take 81 mg by mouth daily. (Patient not taking: Reported on 09/03/2023), Disp: , Rfl:    azaTHIOprine (IMURAN) 50 MG tablet, Take 50 mg by mouth daily., Disp: , Rfl:    Calcium-Magnesium-Vitamin D (CALCIUM 1200+D3 PO), Take 1 tablet by mouth in the morning and at bedtime. (Patient not taking: Reported on 03/03/2024), Disp: , Rfl:    carvedilol (COREG) 25 MG tablet, Take 25 mg by mouth 2 (two) times daily with a meal., Disp: , Rfl:    cyclobenzaprine (FLEXERIL) 10 MG tablet, Take 1 tablet (10 mg total) by mouth 3 (three) times daily as needed for muscle spasms. (Patient not taking: Reported on 06/17/2022), Disp: 30 tablet, Rfl: 0   DILT-XR 240 MG 24 hr capsule, Take 240 mg by mouth daily. (Patient not taking: Reported on 09/18/2023), Disp: , Rfl:    ezetimibe (ZETIA) 10 MG tablet, Take 10 mg by mouth daily., Disp: , Rfl:    HYDROcodone-acetaminophen (NORCO/VICODIN) 5-325 MG tablet, Take 1-2 tablets by mouth every 4 (four) hours as needed (pain). (Patient not taking: Reported on  09/03/2022), Disp: 30 tablet, Rfl: 0   isosorbide mononitrate (IMDUR) 120 MG 24 hr tablet, Take 120 mg by mouth daily., Disp: , Rfl:    omeprazole (PRILOSEC) 40 MG capsule, Take 40 mg by mouth 2 (two) times daily., Disp: , Rfl:    predniSONE (DELTASONE) 5 MG tablet, Take 5 mg by mouth daily., Disp: , Rfl:    vitamin B-12 (CYANOCOBALAMIN) 500 MCG tablet, Take 500 mcg by mouth daily., Disp: , Rfl:   Physical exam:  Vitals:   03/03/24 0929  BP: (!) 144/100  Pulse: (!) 58  Resp: 18  Temp: (!) 96 F (35.6 C)  TempSrc: Tympanic  SpO2: 100%  Weight: 148 lb 12.8 oz (67.5 kg)  Height: 5\' 5"  (1.651 m)   Physical Exam Cardiovascular:     Rate and Rhythm: Normal rate and regular rhythm.     Heart sounds: Normal heart sounds.  Pulmonary:     Effort: Pulmonary effort is normal.     Breath sounds: Normal breath sounds.  Skin:    General: Skin is warm and dry.  Neurological:     Mental Status: She is alert and oriented to person, place, and time.         Latest Ref Rng & Units 08/29/2022    2:53 PM  CMP  Glucose 70 - 99 mg/dL 621   BUN 8 - 23 mg/dL 22   Creatinine 3.08 - 1.00 mg/dL 6.57   Sodium 846 - 962 mmol/L 136   Potassium 3.5 - 5.1 mmol/L 3.9   Chloride 98 - 111 mmol/L 103   CO2 22 - 32 mmol/L 27   Calcium 8.9 - 10.3 mg/dL 9.2   Total Protein 6.5 - 8.1 g/dL 7.8   Total Bilirubin 0.3 - 1.2 mg/dL 0.5   Alkaline Phos 38 - 126 U/L 56   AST 15 - 41 U/L 22   ALT 0 - 44 U/L 13       Latest Ref Rng & Units 03/03/2024    9:10 AM  CBC  WBC 4.0 - 10.5 K/uL 2.6   Hemoglobin 12.0 - 15.0 g/dL 95.2   Hematocrit 84.1 - 46.0 % 35.5   Platelets 150 - 400 K/uL 204  Assessment and plan- Patient is a 83 y.o. female who is here for follow-up of following issues:  Chronic neutropenia: White cell count is 2.6 today with an ANC of 1.Typically her CBC shows a white count of around 3.5.  There is somewhat lower as compared to baseline but not still overall concerning.  She has repeat labs  with Dr. Graciela Husbands in May 2025 which would be okay to follow-up at this time.  She is asymptomatic overall.  Iron deficiency anemia: Hemoglobin is improved from 10.7-11.9.  Ferritin and iron studies are within normal limits and she does not require any IV iron at this time.  History of IgG MGUS: Myeloma panel from today is pending.  We will continue to monitor that on a yearly basis.  So far she has not required any treatment   Visit Diagnosis 1. Chronic neutropenia (HCC)   2. Iron deficiency anemia, unspecified iron deficiency anemia type   3. MGUS (monoclonal gammopathy of unknown significance)      Dr. Owens Shark, MD, MPH [facility] [phone] 03/03/2024 12:41 PM

## 2024-03-04 LAB — KAPPA/LAMBDA LIGHT CHAINS
Kappa free light chain: 31.7 mg/L — ABNORMAL HIGH (ref 3.3–19.4)
Kappa, lambda light chain ratio: 0.79 (ref 0.26–1.65)
Lambda free light chains: 40.3 mg/L — ABNORMAL HIGH (ref 5.7–26.3)

## 2024-03-05 LAB — MULTIPLE MYELOMA PANEL, SERUM
Albumin SerPl Elph-Mcnc: 3.4 g/dL (ref 2.9–4.4)
Albumin/Glob SerPl: 1 (ref 0.7–1.7)
Alpha 1: 0.2 g/dL (ref 0.0–0.4)
Alpha2 Glob SerPl Elph-Mcnc: 0.7 g/dL (ref 0.4–1.0)
B-Globulin SerPl Elph-Mcnc: 1 g/dL (ref 0.7–1.3)
Gamma Glob SerPl Elph-Mcnc: 1.7 g/dL (ref 0.4–1.8)
Globulin, Total: 3.6 g/dL (ref 2.2–3.9)
IgA: 282 mg/dL (ref 64–422)
IgG (Immunoglobin G), Serum: 2195 mg/dL — ABNORMAL HIGH (ref 586–1602)
IgM (Immunoglobulin M), Srm: 34 mg/dL (ref 26–217)
M Protein SerPl Elph-Mcnc: 0.8 g/dL — ABNORMAL HIGH
Total Protein ELP: 7 g/dL (ref 6.0–8.5)

## 2024-05-13 ENCOUNTER — Ambulatory Visit
Admission: RE | Admit: 2024-05-13 | Discharge: 2024-05-13 | Disposition: A | Discharge status: Home / Self Care | Source: Ambulatory Visit | Attending: Gastroenterology | Admitting: Gastroenterology

## 2024-05-13 ENCOUNTER — Encounter: Payer: Self-pay | Admitting: Nurse Practitioner

## 2024-05-13 DIAGNOSIS — K754 Autoimmune hepatitis: Secondary | ICD-10-CM

## 2024-06-21 ENCOUNTER — Telehealth: Payer: Self-pay | Admitting: *Deleted

## 2024-06-21 DIAGNOSIS — D509 Iron deficiency anemia, unspecified: Secondary | ICD-10-CM

## 2024-06-21 NOTE — Telephone Encounter (Signed)
 Says a Dr. Melanee patient and Dr. Babara is on coverage while she is on vacation.  The patient called and said that she has had a diverticulosis bleed and they checked her hemoglobin last week and it was 8.4 and today she went over and got another CBC with Dr. Fernande and now the hemoglobin is 8.1.  He wants to talk to Dr. Melanee that she is not here so we will asked Dr. Babara whether or not she needs a blood transfusion sometime this week, the patient sad that she has no energy.

## 2024-06-21 NOTE — Telephone Encounter (Signed)
 Per Dr. Babara continue to follow up with [facility] regarding rectal bleeding.  Dr. Babara would like you to come in for labs next week (CBC, iron  tibc and hold tube) for possible iron  infusion.  Outbound call to patient; voice message left to return call to clinic.  Lab orders entered.

## 2024-06-22 ENCOUNTER — Encounter: Payer: Self-pay | Admitting: Internal Medicine

## 2024-06-22 ENCOUNTER — Encounter: Payer: Self-pay | Admitting: Nurse Practitioner

## 2024-07-01 ENCOUNTER — Other Ambulatory Visit: Payer: Self-pay

## 2024-07-01 DIAGNOSIS — D509 Iron deficiency anemia, unspecified: Secondary | ICD-10-CM

## 2024-07-02 ENCOUNTER — Inpatient Hospital Stay: Attending: Oncology

## 2024-07-02 DIAGNOSIS — D709 Neutropenia, unspecified: Secondary | ICD-10-CM | POA: Insufficient documentation

## 2024-07-02 DIAGNOSIS — D649 Anemia, unspecified: Secondary | ICD-10-CM | POA: Diagnosis present

## 2024-07-02 DIAGNOSIS — D472 Monoclonal gammopathy: Secondary | ICD-10-CM | POA: Diagnosis present

## 2024-07-02 DIAGNOSIS — D509 Iron deficiency anemia, unspecified: Secondary | ICD-10-CM

## 2024-07-02 LAB — CBC WITH DIFFERENTIAL (CANCER CENTER ONLY)
Abs Immature Granulocytes: 0 K/uL (ref 0.00–0.07)
Basophils Absolute: 0 K/uL (ref 0.0–0.1)
Basophils Relative: 0 %
Eosinophils Absolute: 0 K/uL (ref 0.0–0.5)
Eosinophils Relative: 1 %
HCT: 26.3 % — ABNORMAL LOW (ref 36.0–46.0)
Hemoglobin: 8.8 g/dL — ABNORMAL LOW (ref 12.0–15.0)
Lymphocytes Relative: 42 %
Lymphs Abs: 1.2 K/uL (ref 0.7–4.0)
MCH: 32.8 pg (ref 26.0–34.0)
MCHC: 33.5 g/dL (ref 30.0–36.0)
MCV: 98.1 fL (ref 80.0–100.0)
Monocytes Absolute: 0.3 K/uL (ref 0.1–1.0)
Monocytes Relative: 12 %
Neutro Abs: 1.3 K/uL — ABNORMAL LOW (ref 1.7–7.7)
Neutrophils Relative %: 45 %
Platelet Count: 231 K/uL (ref 150–400)
RBC: 2.68 MIL/uL — ABNORMAL LOW (ref 3.87–5.11)
RDW: 16 % — ABNORMAL HIGH (ref 11.5–15.5)
Smear Review: NORMAL
WBC Count: 2.8 K/uL — ABNORMAL LOW (ref 4.0–10.5)
nRBC: 0 % (ref 0.0–0.2)

## 2024-07-02 LAB — SAMPLE TO BLOOD BANK

## 2024-07-02 LAB — IRON AND TIBC
Iron: 63 ug/dL (ref 28–170)
Saturation Ratios: 19 % (ref 10.4–31.8)
TIBC: 330 ug/dL (ref 250–450)
UIBC: 267 ug/dL

## 2024-07-02 LAB — FERRITIN: Ferritin: 61 ng/mL (ref 11–307)

## 2024-07-07 NOTE — Telephone Encounter (Signed)
 Patient requesting an update on most recent labs drawn.  Per Dr. Babara Hemoglobin has dropped which can be due to the bleeding.  Dr. Babara recommends IV iron .  Schedule patient for IV iron  on Monday. Patient currently has no follow up scheduled with Dr. Melanee.  Outbound call to patient; informed of above.  Patient is in favor of IV iron  and is available to come on Monday.  Forwarding to scheduling to coordinate; had Venofer  in the past.

## 2024-07-07 NOTE — Telephone Encounter (Signed)
 Forwarding to Dr. Babara for review.  Please advise.

## 2024-07-11 NOTE — Telephone Encounter (Signed)
 Proceed with iv iron  as planned. Cbc ferritin and ron studies in 2 and 4 months. I will see her in 4 months

## 2024-07-12 ENCOUNTER — Inpatient Hospital Stay: Attending: Oncology

## 2024-07-12 VITALS — BP 134/79 | HR 55 | Temp 97.0°F | Resp 17

## 2024-07-12 DIAGNOSIS — D509 Iron deficiency anemia, unspecified: Secondary | ICD-10-CM | POA: Diagnosis present

## 2024-07-12 MED ORDER — IRON SUCROSE 20 MG/ML IV SOLN
200.0000 mg | INTRAVENOUS | Status: DC
  Administered 2024-07-12: 200 mg via INTRAVENOUS
  Filled 2024-07-12: qty 10

## 2024-07-12 MED ORDER — SODIUM CHLORIDE 0.9% FLUSH
10.0000 mL | Freq: Once | INTRAVENOUS | Status: AC
  Administered 2024-07-12: 10 mL via INTRAVENOUS
  Filled 2024-07-12: qty 10

## 2024-07-12 NOTE — Patient Instructions (Signed)

## 2024-07-12 NOTE — Progress Notes (Signed)
Patient tolerated Venofer infusion well, no questions/concerns voiced. Monitored 30 min post transfusion. Patient stable at discharge. VSS. AVS given.

## 2024-07-13 NOTE — Addendum Note (Signed)
 Addended by: Kieren Adkison on: 07/13/2024 08:44 AM   Modules accepted: Orders

## 2024-07-19 ENCOUNTER — Inpatient Hospital Stay

## 2024-07-20 ENCOUNTER — Inpatient Hospital Stay

## 2024-07-20 VITALS — BP 135/83 | HR 61 | Temp 97.0°F | Resp 18

## 2024-07-20 DIAGNOSIS — D509 Iron deficiency anemia, unspecified: Secondary | ICD-10-CM

## 2024-07-20 MED ORDER — IRON SUCROSE 20 MG/ML IV SOLN
200.0000 mg | INTRAVENOUS | Status: DC
  Administered 2024-07-20 (×2): 200 mg via INTRAVENOUS
  Filled 2024-07-20: qty 10

## 2024-07-20 NOTE — Patient Instructions (Signed)

## 2024-07-26 ENCOUNTER — Ambulatory Visit

## 2024-07-28 ENCOUNTER — Inpatient Hospital Stay

## 2024-07-28 VITALS — BP 129/87 | HR 57 | Temp 97.2°F | Resp 18

## 2024-07-28 DIAGNOSIS — D509 Iron deficiency anemia, unspecified: Secondary | ICD-10-CM

## 2024-07-28 MED ORDER — IRON SUCROSE 20 MG/ML IV SOLN
200.0000 mg | INTRAVENOUS | Status: DC
  Administered 2024-07-28: 200 mg via INTRAVENOUS
  Filled 2024-07-28: qty 10

## 2024-07-28 NOTE — Patient Instructions (Signed)

## 2024-07-30 ENCOUNTER — Telehealth: Payer: Self-pay | Admitting: Oncology

## 2024-07-30 NOTE — Telephone Encounter (Signed)
 Pt scheduled for iron  appt 8/25 but needs to r/s due to having another appt at the same time. Iron  appt has been r/s to wed. 8/27 per pt preference

## 2024-08-02 ENCOUNTER — Inpatient Hospital Stay

## 2024-08-02 ENCOUNTER — Ambulatory Visit

## 2024-08-04 ENCOUNTER — Inpatient Hospital Stay

## 2024-08-04 VITALS — BP 156/79 | HR 59 | Temp 97.5°F | Resp 18

## 2024-08-04 DIAGNOSIS — D509 Iron deficiency anemia, unspecified: Secondary | ICD-10-CM | POA: Diagnosis not present

## 2024-08-04 MED ORDER — IRON SUCROSE 20 MG/ML IV SOLN
200.0000 mg | INTRAVENOUS | Status: DC
  Administered 2024-08-04: 200 mg via INTRAVENOUS
  Filled 2024-08-04: qty 10

## 2024-08-10 ENCOUNTER — Other Ambulatory Visit: Payer: Self-pay | Admitting: Internal Medicine

## 2024-08-10 DIAGNOSIS — Z1231 Encounter for screening mammogram for malignant neoplasm of breast: Secondary | ICD-10-CM

## 2024-08-11 ENCOUNTER — Inpatient Hospital Stay: Attending: Oncology

## 2024-08-11 ENCOUNTER — Inpatient Hospital Stay

## 2024-08-11 VITALS — BP 117/79 | HR 57 | Temp 96.8°F | Resp 17

## 2024-08-11 DIAGNOSIS — D509 Iron deficiency anemia, unspecified: Secondary | ICD-10-CM | POA: Insufficient documentation

## 2024-08-11 MED ORDER — IRON SUCROSE 20 MG/ML IV SOLN
200.0000 mg | INTRAVENOUS | Status: DC
  Administered 2024-08-11: 200 mg via INTRAVENOUS

## 2024-08-11 NOTE — Patient Instructions (Signed)
 [facility] - A DEPT OF [facility]  Discharge Instructions: Thank you for choosing [facility] to provide your oncology and hematology care.  If you have a lab appointment with the Cancer Center, please go directly to the Cancer Center and check in at the registration area.  Wear comfortable clothing and clothing appropriate for easy access to any Portacath or PICC line.   We strive to give you quality time with your provider. You may need to reschedule your appointment if you arrive late (15 or more minutes).  Arriving late affects you and other patients whose appointments are after yours.  Also, if you miss three or more appointments without notifying the office, you may be dismissed from the clinic at the provider's discretion.      For prescription refill requests, have your pharmacy contact our office and allow 72 hours for refills to be completed.    Today you received the following chemotherapy and/or immunotherapy agents venofer      To help prevent nausea and vomiting after your treatment, we encourage you to take your nausea medication as directed.  BELOW ARE SYMPTOMS THAT SHOULD BE REPORTED IMMEDIATELY: *FEVER GREATER THAN 100.4 F (38 C) OR HIGHER *CHILLS OR SWEATING *NAUSEA AND VOMITING THAT IS NOT CONTROLLED WITH YOUR NAUSEA MEDICATION *UNUSUAL SHORTNESS OF BREATH *UNUSUAL BRUISING OR BLEEDING *URINARY PROBLEMS (pain or burning when urinating, or frequent urination) *BOWEL PROBLEMS (unusual diarrhea, constipation, pain near the anus) TENDERNESS IN MOUTH AND THROAT WITH OR WITHOUT PRESENCE OF ULCERS (sore throat, sores in mouth, or a toothache) UNUSUAL RASH, SWELLING OR PAIN  UNUSUAL VAGINAL DISCHARGE OR ITCHING   Items with * indicate a potential emergency and should be followed up as soon as possible or go to the Emergency Department if any problems should occur.  Please show the CHEMOTHERAPY ALERT CARD or IMMUNOTHERAPY  ALERT CARD at check-in to the Emergency Department and triage nurse.  Should you have questions after your visit or need to cancel or reschedule your appointment, please contact [facility] - A DEPT OF [facility]  [phone] and follow the prompts.  Office hours are 8:00 a.m. to 4:30 p.m. Monday - Friday. Please note that voicemails left after 4:00 p.m. may not be returned until the following business day.  We are closed weekends and major holidays. You have access to a nurse at all times for urgent questions. Please call the main number to the clinic [phone] and follow the prompts.  For any non-urgent questions, you may also contact your provider using MyChart. We now offer e-Visits for anyone 3 and older to request care online for non-urgent symptoms. For details visit [email or web address].   Also download the MyChart app! Go to the app store, search "MyChart", open the app, select [facility], and log in with your MyChart username and password.

## 2024-09-09 ENCOUNTER — Ambulatory Visit
Admission: RE | Admit: 2024-09-09 | Discharge: 2024-09-09 | Disposition: A | Discharge status: Home / Self Care | Source: Ambulatory Visit | Attending: Internal Medicine | Admitting: Internal Medicine

## 2024-09-09 DIAGNOSIS — Z1231 Encounter for screening mammogram for malignant neoplasm of breast: Secondary | ICD-10-CM | POA: Diagnosis present

## 2024-10-26 ENCOUNTER — Other Ambulatory Visit: Payer: Self-pay

## 2024-10-26 DIAGNOSIS — D472 Monoclonal gammopathy: Secondary | ICD-10-CM

## 2024-10-26 DIAGNOSIS — D509 Iron deficiency anemia, unspecified: Secondary | ICD-10-CM

## 2024-10-27 ENCOUNTER — Inpatient Hospital Stay (HOSPITAL_BASED_OUTPATIENT_CLINIC_OR_DEPARTMENT_OTHER): Admitting: Oncology

## 2024-10-27 ENCOUNTER — Other Ambulatory Visit: Payer: Self-pay

## 2024-10-27 ENCOUNTER — Encounter: Payer: Self-pay | Admitting: Oncology

## 2024-10-27 ENCOUNTER — Inpatient Hospital Stay: Attending: Oncology

## 2024-10-27 VITALS — BP 135/83 | HR 54 | Temp 97.7°F | Resp 17 | Ht 65.0 in | Wt 147.7 lb

## 2024-10-27 DIAGNOSIS — D709 Neutropenia, unspecified: Secondary | ICD-10-CM

## 2024-10-27 DIAGNOSIS — D472 Monoclonal gammopathy: Secondary | ICD-10-CM

## 2024-10-27 DIAGNOSIS — D509 Iron deficiency anemia, unspecified: Secondary | ICD-10-CM

## 2024-10-27 LAB — CBC WITH DIFFERENTIAL (CANCER CENTER ONLY)
Abs Immature Granulocytes: 0.02 K/uL (ref 0.00–0.07)
Basophils Absolute: 0 K/uL (ref 0.0–0.1)
Basophils Relative: 1 %
Eosinophils Absolute: 0 K/uL (ref 0.0–0.5)
Eosinophils Relative: 1 %
HCT: 30.5 % — ABNORMAL LOW (ref 36.0–46.0)
Hemoglobin: 10.3 g/dL — ABNORMAL LOW (ref 12.0–15.0)
Immature Granulocytes: 1 %
Lymphocytes Relative: 16 %
Lymphs Abs: 0.7 K/uL (ref 0.7–4.0)
MCH: 31.8 pg (ref 26.0–34.0)
MCHC: 33.8 g/dL (ref 30.0–36.0)
MCV: 94.1 fL (ref 80.0–100.0)
Monocytes Absolute: 0.6 K/uL (ref 0.1–1.0)
Monocytes Relative: 14 %
Neutro Abs: 2.7 K/uL (ref 1.7–7.7)
Neutrophils Relative %: 67 %
Platelet Count: 220 K/uL (ref 150–400)
RBC: 3.24 MIL/uL — ABNORMAL LOW (ref 3.87–5.11)
RDW: 15.8 % — ABNORMAL HIGH (ref 11.5–15.5)
WBC Count: 4 K/uL (ref 4.0–10.5)
nRBC: 0 % (ref 0.0–0.2)

## 2024-10-27 LAB — IRON AND TIBC
Iron: 56 ug/dL (ref 28–170)
Saturation Ratios: 22 % (ref 10.4–31.8)
TIBC: 259 ug/dL (ref 250–450)
UIBC: 203 ug/dL

## 2024-10-27 LAB — FERRITIN: Ferritin: 395 ng/mL — ABNORMAL HIGH (ref 11–307)

## 2024-10-27 NOTE — Progress Notes (Signed)
 Hematology/Oncology Consult note [facility]  [phone] [phone]  Patient Care Team: Fernande Ophelia JINNY DOUGLAS, MD as PCP - General (Internal Medicine)   Name of the patient: Brittney Clark  [id number]  01-18-1941   Date of visit: 10/27/24  Diagnosis- 1. Neutropenia: likely due to imuran  2. Normocytic anemia- likely due to chronic disease and some component of iron  deficiency 3. IgG MGUS  Chief complaint/ Reason for visit-routine follow-up of MGUS neutropenia and anemia  Heme/Onc history: Patient is a 83 year old female with a history of autoimmune hepatitis diagnosed in 2005 who is on Imuran  and sees [facility].  She was also seen by sleep onset Mr. Napoleon Delores Lisette Molly discontinued.  SI today was specific about him today by rheumatology for symptoms of polyarthralgia.  During that time she had some blood work done which showed abnormal SPEP and hence the patient has been referred to us . Most recent CBC from 05/26/2019 showed white count of 2.6, H&H of 10.9/33.5 and a platelet count of 277.  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  She also has normocytic anemia and her baseline hemoglobin runs between 10-11.  Her white count has been a little lower than her baseline of 3-4 since she came off prednisone .  Patient mainly currently reports joint pain and some fatigue   Results of blood work from 06/03/2019 were as follows: CBC showed white count of 3.4, H&H of 10.9/33 with an MCV of 89 and platelet count of 243.  ANC was 1.6.  Smear review showed occasional smudge cells and some abnormal lymphocytes.  Ferritin levels were low at 16.  Iron  studies B12 folate was normal.  TSH was normal reticulocyte count was 1.3 mildly low for the degree of anemia.  Haptoglobin was normal at 150.  Multiple myeloma panel showed IgG lambda M protein of 0.3 g.  Both kappa and lambda free light chains were  elevated with a normal free light chain ratio of 1.4.    Interval history- Discussed the use of AI scribe software for clinical note transcription with the patient, who gave verbal consent to proceed.  She experiences rectal bleeding intermittently over the past two weeks, describing the blood as bright red. She associates the bleeding with her known large internal hemorrhoids, which sometimes protrude. The bleeding is not consistent, as some bowel movements are normal without blood. No vaginal bleeding.  Energy levels are presently good and she denies any recurrent infections or changes in her appetite or weight   ECOG PS- 1 Pain scale- 0   Review of systems- Review of Systems  Constitutional:  Negative for chills, fever, malaise/fatigue and weight loss.  HENT:  Negative for congestion, ear discharge and nosebleeds.   Eyes:  Negative for blurred vision.  Respiratory:  Negative for cough, hemoptysis, sputum production, shortness of breath and wheezing.   Cardiovascular:  Negative for chest pain, palpitations, orthopnea and claudication.  Gastrointestinal:  Positive for blood in stool. Negative for abdominal pain, constipation, diarrhea, heartburn, melena, nausea and vomiting.  Genitourinary:  Negative for dysuria, flank pain, frequency, hematuria and urgency.  Musculoskeletal:  Negative for back pain, joint pain and myalgias.  Skin:  Negative for rash.  Neurological:  Negative for dizziness, tingling, focal weakness, seizures, weakness and headaches.  Endo/Heme/Allergies:  Does not bruise/bleed easily.  Psychiatric/Behavioral:  Negative for depression and suicidal ideas. The patient does not have insomnia.  Allergies  Allergen Reactions   Ferrlecit [Na Ferric Gluc Cplx In Sucrose] Shortness Of Breath and Other (See Comments)    History of allergic reaction to ferric gluconate. Chest pain required ER evaluation   Amlodipine Besy-Benazepril Hcl Other (See Comments)    Hair loss    Atorvastatin Other (See Comments)    Hair loss   Clonidine Other (See Comments)    Hair loss   Diltiazem  Other (See Comments)    Hair loss   Hydralazine Other (See Comments)   Penicillins Rash    Has patient had a PCN reaction causing immediate rash, facial/tongue/throat swelling, SOB or lightheadedness with hypotension: No Has patient had a PCN reaction causing severe rash involving mucus membranes or skin necrosis: No Has patient had a PCN reaction that required hospitalization No Has patient had a PCN reaction occurring within the last 10 years: No If all of the above answers are NO, then may proceed with Cephalosporin use.      Past Medical History:  Diagnosis Date   Anemia    Arthritis    Autoimmune hepatitis (HCC)    DDD (degenerative disc disease), lumbar    GERD (gastroesophageal reflux disease)    Gout    Hypertension    Immune deficiency disorder    Internal hemorrhoids    Leukopenia    Venous stasis      Past Surgical History:  Procedure Laterality Date   ABDOMINAL HYSTERECTOMY     APPENDECTOMY     BREAST BIOPSY Left 1980   neg   BREAST SURGERY     LEFT OOPHORECTOMY     MAXIMUM ACCESS (MAS)POSTERIOR LUMBAR INTERBODY FUSION (PLIF) 1 LEVEL N/A 10/27/2015   Procedure: FOR MAXIMUM ACCESS (MAS) POSTERIOR LUMBAR INTERBODY FUSION (PLIF) L5-S1 MAS with resection of synovial cyst;  Surgeon: Fairy Levels, MD;  Location: [facility];  Service: Neurosurgery;  Laterality: N/A;   NECK SURGERY  2001   ROTATOR CUFF REPAIR Right 2014    Social History   Socioeconomic History   Marital status: Married    Spouse name: Not on file   Number of children: 2   Years of education: Not on file   Highest education level: Not on file  Occupational History   Not on file  Tobacco Use   Smoking status: Never   Smokeless tobacco: Never  Vaping Use   Vaping status: Never Used  Substance and Sexual Activity   Alcohol use: No   Drug use: Never   Sexual activity: Yes   Other Topics Concern   Not on file  Social History Narrative   Not on file   Social Drivers of Health   Financial Resource Strain: Low Risk  (04/22/2024)   Received from [facility]   Overall Financial Resource Strain (CARDIA)    Difficulty of Paying Living Expenses: Not hard at all  Food Insecurity: No Food Insecurity (04/22/2024)   Received from [facility]   Hunger Vital Sign    Within the past 12 months, you worried that your food would run out before you got the money to buy more.: Never true    Within the past 12 months, the food you bought just didn't last and you didn't have money to get more.: Never true  Transportation Needs: No Transportation Needs (04/22/2024)   Received from [facility] - Transportation    In the past 12 months, has lack of transportation kept you from medical appointments  or from getting medications?: No    Lack of Transportation (Non-Medical): No  Physical Activity: Not on file  Stress: Not on file  Social Connections: Not on file  Intimate Partner Violence: Not on file    Family History  Problem Relation Age of Onset   Breast cancer Paternal Aunt    Stroke Mother    Aneurysm Father    Stroke Paternal Grandfather    Diabetes Neg Hx      Current Outpatient Medications:    azaTHIOprine  (IMURAN ) 50 MG tablet, Take 50 mg by mouth daily., Disp: , Rfl:    carvedilol  (COREG ) 25 MG tablet, Take 25 mg by mouth 2 (two) times daily with a meal., Disp: , Rfl:    ezetimibe (ZETIA) 10 MG tablet, Take 10 mg by mouth daily., Disp: , Rfl:    isosorbide  mononitrate (IMDUR ) 120 MG 24 hr tablet, Take 120 mg by mouth daily. (Patient taking differently: Take by mouth daily. 60mg  in the am, 30mg  nightly), Disp: , Rfl:    Melatonin 1 MG CAPS, Take by mouth as needed., Disp: , Rfl:    omeprazole (PRILOSEC) 40 MG capsule, Take 40 mg by mouth 2 (two) times daily., Disp: , Rfl:    potassium chloride   (KLOR-CON  M) 10 MEQ tablet, Take by mouth., Disp: , Rfl:    predniSONE  (DELTASONE ) 5 MG tablet, Take 5 mg by mouth daily., Disp: , Rfl:    triamterene-hydrochlorothiazide (MAXZIDE) 75-50 MG tablet, Take 1 tablet by mouth daily., Disp: , Rfl:    vitamin B-12 (CYANOCOBALAMIN ) 500 MCG tablet, Take 500 mcg by mouth daily., Disp: , Rfl:    aspirin  EC 81 MG tablet, Take 81 mg by mouth daily. (Patient not taking: Reported on 09/03/2023), Disp: , Rfl:    Calcium -Magnesium-Vitamin D  (CALCIUM  1200+D3 PO), Take 1 tablet by mouth in the morning and at bedtime. (Patient not taking: Reported on 03/03/2024), Disp: , Rfl:    cyclobenzaprine  (FLEXERIL ) 10 MG tablet, Take 1 tablet (10 mg total) by mouth 3 (three) times daily as needed for muscle spasms. (Patient not taking: Reported on 06/17/2022), Disp: 30 tablet, Rfl: 0   DILT-XR 240 MG 24 hr capsule, Take 240 mg by mouth daily. (Patient not taking: Reported on 09/18/2023), Disp: , Rfl:    HYDROcodone -acetaminophen  (NORCO/VICODIN) 5-325 MG tablet, Take 1-2 tablets by mouth every 4 (four) hours as needed (pain). (Patient not taking: Reported on 09/03/2022), Disp: 30 tablet, Rfl: 0  Physical exam:  Vitals:   10/27/24 1007 10/27/24 1013  BP: (!) 155/92 135/83  Pulse: 60 (!) 54  Resp: 17   Temp: 97.7 F (36.5 C)   SpO2: 100%   Weight: 147 lb 11.2 oz (67 kg)   Height: 5' 5 (1.651 m)    Physical Exam Cardiovascular:     Rate and Rhythm: Normal rate and regular rhythm.     Heart sounds: Normal heart sounds.  Pulmonary:     Effort: Pulmonary effort is normal.     Breath sounds: Normal breath sounds.  Skin:    General: Skin is warm and dry.  Neurological:     Mental Status: She is alert and oriented to person, place, and time.      I have personally reviewed labs listed below:    Latest Ref Rng & Units 08/29/2022    2:53 PM  CMP  Glucose 70 - 99 mg/dL 813   BUN 8 - 23 mg/dL 22   Creatinine 9.55 - 1.00 mg/dL 8.89   Sodium  135 - 145 mmol/L 136    Potassium 3.5 - 5.1 mmol/L 3.9   Chloride 98 - 111 mmol/L 103   CO2 22 - 32 mmol/L 27   Calcium  8.9 - 10.3 mg/dL 9.2   Total Protein 6.5 - 8.1 g/dL 7.8   Total Bilirubin 0.3 - 1.2 mg/dL 0.5   Alkaline Phos 38 - 126 U/L 56   AST 15 - 41 U/L 22   ALT 0 - 44 U/L 13       Latest Ref Rng & Units 10/27/2024    9:46 AM  CBC  WBC 4.0 - 10.5 K/uL 4.0   Hemoglobin 12.0 - 15.0 g/dL 89.6   Hematocrit 63.9 - 46.0 % 30.5   Platelets 150 - 400 K/uL 220      Assessment and plan- Patient is a 83 y.o. 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 - Continue current management with Imuran  as per GI  Chronic iron  deficiency anemia Hemoglobin stable between 10-11, currently 10.3. Awaiting iron  levels. - Await iron  level results. - Schedule iron  infusions if levels are low.  Rectal bleeding likely due to internal hemorrhoids Intermittent bright red rectal bleeding likely from internal hemorrhoids. No recent colonoscopy since 2022 or 2023. - Use over-the-counter treatments such as Prep H or sitz baths. Patient will need to get in touch with [facility] if rectal bleeding persists.  Monoclonal gammopathy of undetermined significance (MGUS) MGUS with stable abnormal protein levels.  Levels from today are pending - Continue annual monitoring of MGUS.      CBC ferritin and iron  studies in 4 and 8 months and I will see her back in 8 months   Visit Diagnosis 1. Iron  deficiency anemia, unspecified iron  deficiency anemia type   2. Chronic neutropenia   3. MGUS (monoclonal gammopathy of unknown significance)      Dr. Annah Skene, MD, MPH [facility] [phone] 10/27/2024 12:17 PM

## 2024-10-27 NOTE — Progress Notes (Signed)
 trouble sleeping sometimes, takes Melatonin.

## 2024-10-28 LAB — KAPPA/LAMBDA LIGHT CHAINS
Kappa free light chain: 35 mg/L — ABNORMAL HIGH (ref 3.3–19.4)
Kappa, lambda light chain ratio: 0.66 (ref 0.26–1.65)
Lambda free light chains: 53.2 mg/L — ABNORMAL HIGH (ref 5.7–26.3)

## 2024-10-29 ENCOUNTER — Encounter: Payer: Self-pay | Admitting: Oncology

## 2024-10-29 LAB — MULTIPLE MYELOMA PANEL, SERUM
Albumin SerPl Elph-Mcnc: 3.3 g/dL (ref 2.9–4.4)
Albumin/Glob SerPl: 0.9 (ref 0.7–1.7)
Alpha 1: 0.2 g/dL (ref 0.0–0.4)
Alpha2 Glob SerPl Elph-Mcnc: 0.8 g/dL (ref 0.4–1.0)
B-Globulin SerPl Elph-Mcnc: 0.9 g/dL (ref 0.7–1.3)
Gamma Glob SerPl Elph-Mcnc: 1.9 g/dL — ABNORMAL HIGH (ref 0.4–1.8)
Globulin, Total: 3.8 g/dL (ref 2.2–3.9)
IgA: 282 mg/dL (ref 64–422)
IgG (Immunoglobin G), Serum: 2113 mg/dL — ABNORMAL HIGH (ref 586–1602)
IgM (Immunoglobulin M), Srm: 28 mg/dL (ref 26–217)
M Protein SerPl Elph-Mcnc: 0.7 g/dL — ABNORMAL HIGH
Total Protein ELP: 7.1 g/dL (ref 6.0–8.5)

## 2024-11-03 ENCOUNTER — Other Ambulatory Visit

## 2024-11-03 ENCOUNTER — Ambulatory Visit: Admitting: Oncology

## 2024-11-19 ENCOUNTER — Encounter: Payer: Self-pay | Admitting: Nurse Practitioner

## 2024-12-08 ENCOUNTER — Other Ambulatory Visit (INDEPENDENT_AMBULATORY_CARE_PROVIDER_SITE_OTHER): Payer: Self-pay | Admitting: Vascular Surgery

## 2024-12-08 DIAGNOSIS — I7143 Infrarenal abdominal aortic aneurysm, without rupture: Secondary | ICD-10-CM

## 2024-12-16 ENCOUNTER — Ambulatory Visit (INDEPENDENT_AMBULATORY_CARE_PROVIDER_SITE_OTHER)

## 2024-12-16 ENCOUNTER — Encounter (INDEPENDENT_AMBULATORY_CARE_PROVIDER_SITE_OTHER): Payer: Self-pay | Admitting: Nurse Practitioner

## 2024-12-16 ENCOUNTER — Ambulatory Visit (INDEPENDENT_AMBULATORY_CARE_PROVIDER_SITE_OTHER): Payer: Self-pay | Admitting: Nurse Practitioner

## 2024-12-16 VITALS — BP 197/92 | HR 51 | Resp 17 | Ht 65.0 in | Wt 143.8 lb

## 2024-12-16 DIAGNOSIS — I7143 Infrarenal abdominal aortic aneurysm, without rupture: Secondary | ICD-10-CM

## 2024-12-16 DIAGNOSIS — I10 Essential (primary) hypertension: Secondary | ICD-10-CM | POA: Diagnosis not present

## 2024-12-16 DIAGNOSIS — I723 Aneurysm of iliac artery: Secondary | ICD-10-CM

## 2024-12-16 DIAGNOSIS — E785 Hyperlipidemia, unspecified: Secondary | ICD-10-CM

## 2024-12-26 ENCOUNTER — Encounter (INDEPENDENT_AMBULATORY_CARE_PROVIDER_SITE_OTHER): Payer: Self-pay | Admitting: Nurse Practitioner

## 2024-12-26 NOTE — Progress Notes (Signed)
 "  Subjective:    Patient ID: Brittney Clark, female    DOB: [birth year], 84 y.o.   MRN: [id number] Chief Complaint  Patient presents with   New Patient (Initial Visit)    np. AAA + consult. Infarenal AAA w/out rupture  ref: Klein,Bert Referred to transfer care closer to home per pt    HPI  Discussed the use of AI scribe software for clinical note transcription with the patient, who gave verbal consent to proceed.  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  She has a known abdominal aortic aneurysm previously monitored at [facility], with her last CT scan measuring 3.5 cm. At today's visit, ultrasound measured the aneurysm at 2.9 cm, which is attributed to imaging technique variation; the aneurysm is considered stable at approximately 3.5 cm.  She has a common iliac artery aneurysm measuring 1.7 cm, which is mildly enlarged but does not meet criteria for intervention.  She denies symptoms suggestive of aneurysm rupture, including sudden severe abdominal pain, back pain, or acute changes in her lower extremities such as coldness, numbness, pain, or color changes in her feet or toes. She remains physically active, engaging in walking, gardening, and gym activities, and does not participate in contact sports or powerlifting. She does not use tobacco or marijuana products.    Results Radiology Abdominal aorta ultrasound (12/16/2024): Maximum diameter 2.9 cm; stable compared to prior imaging; no interval growth Common iliac artery ultrasound (12/16/2024): Maximum diameter 1.7 cm; mild aneurysmal dilation; no interval growth Abdominal aorta CT: Maximum diameter 3.5 cm; stable compared to prior imaging   Review of Systems  Cardiovascular:  Negative for leg swelling.  Gastrointestinal:  Negative for abdominal pain.  All other systems  reviewed and are negative.      Objective:   Physical Exam Vitals reviewed.  HENT:     Head: Normocephalic.  Cardiovascular:     Rate and Rhythm: Normal rate.     Pulses: Normal pulses.  Pulmonary:     Effort: Pulmonary effort is normal.  Skin:    General: Skin is warm and dry.  Neurological:     Mental Status: She is alert and oriented to person, place, and time.  Psychiatric:        Mood and Affect: Mood normal.        Behavior: Behavior normal.        Thought Content: Thought content normal.        Judgment: Judgment normal.     Physical Exam CARDIOVASCULAR: Peripheral pulses strong  BP (!) 197/92 Comment: PT has not had bp medications for the morning  Pulse (!) 51   Resp 17   Ht 5' 5 (1.651 m)   Wt 143 lb 12.8 oz (65.2 kg)   BMI 23.93 kg/m   Past Medical History:  Diagnosis Date   Anemia    Arthritis    Autoimmune hepatitis (HCC)    DDD (degenerative disc disease), lumbar    GERD (gastroesophageal reflux disease)    Gout    Hypertension    Immune deficiency disorder    Internal hemorrhoids    Leukopenia    Venous stasis     Social History   Socioeconomic History   Marital status: Married    Spouse name: Not on file   Number of children: 2   Years of education: Not on  file   Highest education level: Not on file  Occupational History   Not on file  Tobacco Use   Smoking status: Never   Smokeless tobacco: Never  Vaping Use   Vaping status: Never Used  Substance and Sexual Activity   Alcohol use: No   Drug use: Never   Sexual activity: Yes  Other Topics Concern   Not on file  Social History Narrative   Not on file   Social Drivers of Health   Tobacco Use: Low Risk (12/26/2024)   Patient History    Smoking Tobacco Use: Never    Smokeless Tobacco Use: Never    Passive Exposure: Not on file  Financial Resource Strain: Low Risk  (04/22/2024)   Received from [facility]   Overall Financial Resource Strain (CARDIA)     Difficulty of Paying Living Expenses: Not hard at all  Food Insecurity: No Food Insecurity (04/22/2024)   Received from [facility]   Epic    Within the past 12 months, you worried that your food would run out before you got the money to buy more.: Never true    Within the past 12 months, the food you bought just didn't last and you didn't have money to get more.: Never true  Transportation Needs: No Transportation Needs (04/22/2024)   Received [facility] - Transportation    In the past 12 months, has lack of transportation kept you from medical appointments or from getting medications?: No    Lack of Transportation (Non-Medical): No  Physical Activity: Not on file  Stress: Not on file  Social Connections: Not on file  Intimate Partner Violence: Not on file  Depression (PHQ2-9): Low Risk (10/27/2024)   Depression (PHQ2-9)    PHQ-2 Score: 0  Alcohol Screen: Not on file  Housing: Low Risk  (06/21/2024)   Received from [facility]   Epic    In the last 12 months, was there a time when you were not able to pay the mortgage or rent on time?: No    In the past 12 months, how many times have you moved where you were living?: 0    At any time in the past 12 months, were you homeless or living in a shelter (including now)?: No  Utilities: Not At Risk (04/22/2024)   Received from [facility] Utilities    Threatened with loss of utilities: No  Health Literacy: Not on file    Past Surgical History:  Procedure Laterality Date   ABDOMINAL HYSTERECTOMY     APPENDECTOMY     BREAST BIOPSY Left 1980   neg   BREAST SURGERY     LEFT OOPHORECTOMY     MAXIMUM ACCESS (MAS)POSTERIOR LUMBAR INTERBODY FUSION (PLIF) 1 LEVEL N/A 10/27/2015   Procedure: FOR MAXIMUM ACCESS (MAS) POSTERIOR LUMBAR INTERBODY FUSION (PLIF) L5-S1 MAS with resection of synovial cyst;  Surgeon: Fairy Levels, MD;  Location: [facility];   Service: Neurosurgery;  Laterality: N/A;   NECK SURGERY  2001   ROTATOR CUFF REPAIR Right 2014    Family History  Problem Relation Age of Onset   Breast cancer Paternal Aunt    Stroke Mother    Aneurysm Father    Stroke Paternal Grandfather    Diabetes Neg Hx     Allergies[1]     Latest Ref Rng & Units 10/27/2024    9:46 AM 07/02/2024  9:35 AM 03/03/2024    9:10 AM  CBC  WBC 4.0 - 10.5 K/uL 4.0  2.8  2.6   Hemoglobin 12.0 - 15.0 g/dL 89.6  8.8  88.0   Hematocrit 36.0 - 46.0 % 30.5  26.3  35.5   Platelets 150 - 400 K/uL 220  231  204       CMP     Component Value Date/Time   NA 136 08/29/2022 1453   NA 138 12/11/2013 1508   K 3.9 08/29/2022 1453   K 3.7 12/11/2013 1508   CL 103 08/29/2022 1453   CL 104 12/11/2013 1508   CO2 27 08/29/2022 1453   CO2 28 12/11/2013 1508   GLUCOSE 186 (H) 08/29/2022 1453   GLUCOSE 164 (H) 12/11/2013 1508   BUN 22 08/29/2022 1453   BUN 10 12/11/2013 1508   CREATININE 1.10 (H) 08/29/2022 1453   CREATININE 0.91 12/11/2013 1508   CALCIUM  9.2 08/29/2022 1453   CALCIUM  9.9 12/11/2013 1508   PROT 7.8 08/29/2022 1453   PROT 7.7 12/11/2013 1508   ALBUMIN 3.5 08/29/2022 1453   ALBUMIN 3.5 12/11/2013 1508   AST 22 08/29/2022 1453   AST 16 12/11/2013 1508   ALT 13 08/29/2022 1453   ALT 20 12/11/2013 1508   ALKPHOS 56 08/29/2022 1453   ALKPHOS 57 12/11/2013 1508   BILITOT 0.5 08/29/2022 1453   BILITOT 0.6 12/11/2013 1508   GFRNONAA 51 (L) 08/29/2022 1453   GFRNONAA >60 12/11/2013 1508     No results found.     Assessment & Plan:  1. Infrarenal abdominal aortic aneurysm (AAA) without rupture (Primary) Infrarenal abdominal aortic aneurysm without rupture Chronic infrarenal abdominal aortic aneurysm, stable at 2.9 cm on ultrasound, low rupture risk. No intervention needed unless diameter reaches 5 cm or rapid growth occurs. Hypertension and tobacco use are risk factors. - Recommended annual surveillance imaging. - Advised blood  pressure monitoring and tobacco cessation. - Counseled to continue normal activities, avoid contact sports and power lifting. - Provided education on warning signs of rupture and distal embolization, instructed to seek emergency care if these occur. - Scheduled follow-up for continued surveillance. - VAS US  AAA DUPLEX; Future  2. 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 No intervention needed unless criteria met. Risk factor modification and activity recommendations same as for abdominal aortic aneurysm. - Recommended continued surveillance with abdominal aortic aneurysm monitoring. - Reinforced that both aneurysms can be treated simultaneously if intervention is required. - Reiterated activity and risk factor modification recommendations.  3. Essential hypertension Continue antihypertensive medications as already ordered, these medications have been reviewed and there are no changes at this time.  4. Hyperlipidemia, unspecified hyperlipidemia type Continue statin as ordered and reviewed, no changes at this time   Assessment and Plan Assessment & Plan       Medications Ordered Prior to Encounter[2]  There are no Patient Instructions on file for this visit. Return in about 1 year (around 12/16/2025) for AAA 1 year gs/fb.   Preethi Scantlebury E Vaniah Chambers, NP      [1]  Allergies Allergen Reactions   Ferrlecit [Na Ferric Gluc Cplx In Sucrose] Shortness Of Breath and Other (See Comments)    History of allergic reaction to ferric gluconate. Chest pain required ER evaluation   Amlodipine Besy-Benazepril Hcl Other (See Comments)    Hair loss   Atorvastatin Other (See Comments)    Hair loss  Clonidine Other (See Comments)    Hair loss   Diltiazem  Other (See Comments)    Hair loss   Hydralazine Other (See Comments)   Penicillins Rash    Has patient had a PCN reaction causing immediate rash,  facial/tongue/throat swelling, SOB or lightheadedness with hypotension: No Has patient had a PCN reaction causing severe rash involving mucus membranes or skin necrosis: No Has patient had a PCN reaction that required hospitalization No Has patient had a PCN reaction occurring within the last 10 years: No If all of the above answers are NO, then may proceed with Cephalosporin use.   [2]  Current Outpatient Medications on File Prior to Visit  Medication Sig Dispense Refill   azaTHIOprine  (IMURAN ) 50 MG tablet Take 50 mg by mouth daily.     carvedilol  (COREG ) 25 MG tablet Take 25 mg by mouth 2 (two) times daily with a meal.     ezetimibe (ZETIA) 10 MG tablet Take 10 mg by mouth daily.     isosorbide  mononitrate (IMDUR ) 120 MG 24 hr tablet Take 120 mg by mouth daily. (Patient taking differently: Take by mouth daily. 60mg  in the am, 30mg  nightly)     Melatonin 1 MG CAPS Take by mouth as needed.     omeprazole (PRILOSEC) 40 MG capsule Take 40 mg by mouth 2 (two) times daily.     predniSONE  (DELTASONE ) 5 MG tablet Take 5 mg by mouth daily.     triamterene-hydrochlorothiazide (MAXZIDE) 75-50 MG tablet Take 1 tablet by mouth daily.     vitamin B-12 (CYANOCOBALAMIN ) 500 MCG tablet Take 500 mcg by mouth daily.     aspirin  EC 81 MG tablet Take 81 mg by mouth daily. (Patient not taking: Reported on 09/03/2023)     Calcium -Magnesium-Vitamin D  (CALCIUM  1200+D3 PO) Take 1 tablet by mouth in the morning and at bedtime. (Patient not taking: Reported on 03/03/2024)     cyclobenzaprine  (FLEXERIL ) 10 MG tablet Take 1 tablet (10 mg total) by mouth 3 (three) times daily as needed for muscle spasms. (Patient not taking: Reported on 06/17/2022) 30 tablet 0   DILT-XR 240 MG 24 hr capsule Take 240 mg by mouth daily. (Patient not taking: Reported on 09/18/2023)     HYDROcodone -acetaminophen  (NORCO/VICODIN) 5-325 MG tablet Take 1-2 tablets by mouth every 4 (four) hours as needed (pain). (Patient not taking: Reported on  09/03/2022) 30 tablet 0   No current facility-administered medications on file prior to visit.   "

## 2025-01-05 NOTE — Progress Notes (Signed)
 "  PATIENT PROFILE: Brittney Clark is a 84 y.o. female who presents for a visit to the GI clinic at [facility]  for a visit to follow up  PROBLEM LIST:  Patient Active Problem List  Diagnosis   Venous stasis   Essential hypertension   Hyperlipidemia   Hiatal hernia   Diverticulosis   Leukopenia   Autoimmune hepatitis (CMS/HHS-HCC)   Microscopic hematuria   Osteoporosis, post-menopausal   Acquired cyst of kidney   B12 deficiency   DDD (degenerative disc disease), lumbar   Lumbar radiculitis   Osteoarthritis of spine with radiculopathy, lumbar region   Spondylolisthesis of lumbar region   Immunosuppression (HHS-HCC)   Elevated erythrocyte sedimentation rate   Cervical disc disease   Iron  deficiency anemia   Aortic atherosclerosis   Degenerative joint disease of ankle and foot, left   Degenerative joint disease of ankle and foot, right   Congenital metatarsus adductus of both feet   Ankle sprain   Infrarenal abdominal aortic aneurysm (AAA) without rupture ()   History of GI diverticular bleed   MGUS (monoclonal gammopathy of unknown significance)   Stage 3a chronic kidney disease (CMS-HCC)   Prediabetes    Endoscopic History: Colonoscopy 02/20/23- [facility]- hemorrhoids, multiple diverticula, red blood in sigmoid colon without any clear culprit for bleeding found. Prep noted to be good otherwise Colonoscopy 08/28/21- [facility]/rectal bleeding- adenomatous polyp EGD 12/20- [facility]- normal exam Colonoscopy 01/11/13- Dr Toma heme + stool- diverticulosis, 10y repeat recommended.-  EGD 01/11/2013- Dr Ora,  appeared normal 05/10/2008 Colonoscopy revealed internal hemorrhoids as well as diverticulosis involving the sigmoid and descending colon. 12/06/2003 colonoscopy was unremarkable as well as colonoscopy performed 10/16/2001.-   Had liver biopsy 2005 with grade 3 fibrosis and AIH Tpmt metabolites 1/25: <217, 6 tgn 314    AAA duplex US1/26: Summary:  Abdominal  Aorta: There is evidence of abnormal dilation of the Right Common Iliac artery. The largest aortic measurement is 2.9 cm. The Aorta measures 2.90 cm, previous CT suggested 3.4 cm 0n 07/18/2022.   Abdominal US  6/25- normal   CTA 2/25- Vascular:  --Unchanged aneurysmal dilation of the infrarenal aorta measuring up to 3.7 cm. Similar appearance of the penetrating atherosclerotic ulcer.  Nonvascular:  --Stable, chronic findings as described in the body of the report.    Abd US  7/24- IMPRESSION:  No acute abnormality identified. Previously noted gallbladder polyp  is not seen on current exam.    CTA A/P 02/19/23- No overt active arterial extravasation identified. There are two areas of arterial enhancement of the mucosa in the sigmoid colon and rectum persist on delayed imaging which is slightly out of proportion to the adjacent loops of bowel which could represent active bleeding in the appropriate clinical setting. Stable 3.5 cm infrarenal abdominal aortic aneurysm with associated penetrating atherosclerotic ulcer.    [facility] chest/ab/pelvis 2/24- Penetrating atherosclerotic ulcer in the infrarenal abdominal aorta with associated aneurysmal dilation measuring up to 3.5 x 2.9 cm, similar to prior.    Abdominal US  11/23- mild steatosis, small gb polyp. 3.3 infrarenal aaa CT A/P 8/23-  IMPRESSION:  1. Constipation.  2. Infrarenal abdominal aorta aneurysm (3.4 cm.) Recommend follow-up  ultrasound every 3 years. This recommendation follows ACR consensus  guidelines: White Paper of the ACR Incidental Findings Committee II  on Vascular Findings. J Am Coll Radiol 2013; 10:789-794.  3. Diffuse sigmoid diverticulosis with no acute diverticulitis.  She is following with vascular for the AAA CT A/P 7/23: 1.Diffuse sigmoid bowel thickening and  diverticulosis with mild inflammatory changes around several of the sigmoid diverticuli, favor chronic changes but which could reflect early diverticulitis. No  associated abscess or bowel perforation.  2.Penetrating ulcer with surrounding plaque in the infrarenal aorta measuring up to 3.8 cm in greatest axial dimension, vascular consultation is recommended.  Abdominal US  2/23- fatty liver- no noted advanced fibrosis on elastography Abdominal US  11/10/20- unremarkable exam with Abdominal US  09/06/19- no liver abnormalities Abdominal US  with fibroscan 0/19- normal exam, F0/F1 fibrosis.  INTERVAL HISTORY:  [name] was last seen on 11/10/24 [facility]- very much likely anal outlet- better now. Conservative measures reviewed. Hgb stable. Elevated ferritin- may have been reactive for unknown reason but also have received iron - plan to repeat at follow up next month Follow up next month as planned, sooner if problems.   Since the last visit, was seen at the [facility] ED 12/04/24 for rectal bleeding- hemoglobin stable and bleeding thought to be hemorrhoidal/anal outlet. States she has had one more day 2w ago with spontaneous rectal bleeding x 1d- 3 episodes but not since. This was not associated with diarrhea/constipation- did have some llq discomfort but no pain or other sx with this. Denies any upper gi symptoms. States she is tolerating her AZA well and has had no problems with this. Liver enzymes and platelet count normal. Pt/inr normal 12/25. Continues to follow with Dr Melanee for her IDA- last seen 11/25- ferritin elevated, iron  panel normal and hgb stable at 10; also seen by vascular 1/26 in follow up of infrarenal aaa. We reviewed her symptoms/exams/plan of care Her questions were answered  Medications:  Outpatient Encounter Medications as of 01/05/2025  Medication Sig Dispense Refill   azaTHIOprine  (IMURAN ) 50 mg tablet TAKE 1 TABLET ONCE DAILY 90 tablet 1   carvediloL  (COREG ) 25 MG tablet TAKE 1 TABLET BY MOUTH TWICE A DAY WITH FOOD 180 tablet 1   cyanocobalamin  (VITAMIN B12) 500 MCG tablet Take 500 mcg by mouth once daily        ezetimibe (ZETIA) 10 mg  tablet TAKE 1 TABLET BY MOUTH EVERY DAY 90 tablet 3   isosorbide  mononitrate (IMDUR ) 30 MG ER tablet Take 2 po in AM, 1 po in PM 90 tablet 11   omeprazole (PRILOSEC) 40 MG DR capsule Take 1 capsule (40 mg total) by mouth 2 (two) times daily 180 capsule 3   potassium chloride  (KLOR-CON  M10) 10 mEq ER tablet Take 1 tablet (10 mEq total) by mouth 2 (two) times daily 180 tablet 3   predniSONE  (DELTASONE ) 5 MG tablet TAKE 1 TABLET BY MOUTH EVERY DAY 90 tablet 4   triamterene-hydroCHLOROthiazide (MAXZIDE) 75-50 mg tablet TAKE 1 TABLET ONCE DAILY 90 tablet 3   No facility-administered encounter medications on file as of 01/05/2025.     ALLERGIES: Atorvastatin, Clonidine, Ferrous gluconate, Hydralazine, Losartan , Lotrel [amlodipine-benazepril], Norvasc [amlodipine], and Penicillin v potassium   PMHx:  Past Medical History:  Diagnosis Date   Anemia    B12, iron , folate normal.    Autoimmune hepatitis (CMS/HHS-HCC)    B12 deficiency 01/30/2015   Back problem    DDD (degenerative disc disease)    with cervical and lumbar radiculopathy   Diverticulosis    Fibrocystic breast disease    GERD (gastroesophageal reflux disease)    Hiatal hernia    History of hypokalemia    Hyperlipidemia    Hypertension    Internal hemorrhoids    Leukopenia    B12, iron , folate normal.    Microscopic hematuria  Urology evaluation 2011   Osteoporosis, post-menopausal    Venous stasis    with peripheral edema     PSHx: Past Surgical History:  Procedure Laterality Date   Breast surgery   1971   HYSTERECTOMY  1980   left oophorectomy.   Neck surgery  2002   COLONOSCOPY  10/16/2001   Normal..AW, CMA**   EGD  10/16/2001   Normal..AW, CMA**   COLONOSCOPY  12/06/2003   Normal..AW, CMA   COLONOSCOPY  05/10/2008   Internal hemorrhoids/Diverticulosis..AW, CMA*   COLONOSCOPY  01/11/2013   (Heme positive stool) / diverticulosis sigmoid colon/otherwise normal/ repeat 10  years/ Laruth Lewis, RN   EGD  01/11/2013   (Heme positive stool) / normal / no repeat/ [place] - Clarita Lewis, RN   Right shoulder decompression  05/2013   with arthroscopic release of long head of the biceps tendon   POSTERIOR LUMBAR SPINE FUSION ONE LEVEL  12/2015   APPENDECTOMY     COLONOSCOPY       FAMILY HISTORY: Family History  Problem Relation Name Age of Onset   High blood pressure (Hypertension) Mother B    Stroke Mother B    Diabetes Mother B    High blood pressure (Hypertension) Father R    High blood pressure (Hypertension) Other sibling    Diabetes Other     Colon polyps Other       Social History: Social History   Socioeconomic History   Marital status: Married  Occupational History   Occupation: Retired  Tobacco Use   Smoking status: Never   Smokeless tobacco: Never  Vaping Use   Vaping status: Never Used  Substance and Sexual Activity   Alcohol use: No   Drug use: No   Sexual activity: Yes    Partners: Male    Birth control/protection: None   Social Drivers of Corporate Investment Banker Strain: Low Risk  (04/22/2024)   Overall Financial Resource Strain (CARDIA)    Difficulty of Paying Living Expenses: Not hard at all  Food Insecurity: No Food Insecurity (04/22/2024)   Hunger Vital Sign    Worried About Running Out of Food in the Last Year: Never true    Ran Out of Food in the Last Year: Never true  Transportation Needs: No Transportation Needs (04/22/2024)   PRAPARE - Administrator, Civil Service (Medical): No    Lack of Transportation (Non-Medical): No     REVIEW OF SYSTEMS:   10 systems reviewed, unremarkable other than what is written above.    PHYSICAL EXAM:  Vitals:   01/05/25 0935  BP: 130/75  Pulse: 62  Temp: 36.6 C (97.9 F)   Body mass index is 25.18 kg/m. Weight: 66.5 kg (146 lb 11.2 oz)   General Appearance:    Alert, cooperative, no distress, appears stated age Head:      Atraumatic, normocephalic Eyes:   Anciteric, conjunctiva pink. Mouth: no oral ulcers Lungs:     Respirations eupneic.  Clear to auscultation bilaterally  Heart:    Regular rate and rhythm, S1 and S2 normal, no murmur, rub   or gallop Abdomen:     Flat, bowel sounds x 4.Soft, non-tender/nondistended. No guarding, rigidity, rebound tenderness or other peritoneal signs Extremities:   No cyanosis or edema, moves all extremities well. Strength 5/5. Skin:   Skin color, texture, turgor normal, no rashes or lesions Neuro: alert, oriented x 3, cranial nerves II-XII intact Psych: pleasant, calm, cooperative, Logical thought and good insight.  REVIEW OF DATA: I have reviewed the following data today:  Prior notes labs endoscopy reports imaging    ASSESSMENT AND PLAN:   Rectal bleeding- possibly anal outlet v other- recurrent. Hx adenoma 2022, looks like sigmoid colon visualization possibly obscured by bleeding at the time. Intermittent llq discomfort. There is no constipation/diarrhea. Recommend repeat flex sig for luminal eval.  Procedure information given: indications, benefits, risks- including, but not limited to bleeding, infection, perforation, difficulty with sedation, were discussed with the patient, and they are agreeable to the procedure. Considering referral for opinion on hemorrhoid banding as clinically indicated. Cbc today AIH- noncirrhotic but f3 fibrosis. Thiopurine metabolites. Schedule ruq US  Follow up 3-75m sooner if problems "

## 2025-01-06 ENCOUNTER — Telehealth: Payer: Self-pay

## 2025-01-06 ENCOUNTER — Other Ambulatory Visit: Payer: Self-pay

## 2025-01-06 ENCOUNTER — Other Ambulatory Visit: Payer: Self-pay | Admitting: Gastroenterology

## 2025-01-06 DIAGNOSIS — K625 Hemorrhage of anus and rectum: Secondary | ICD-10-CM

## 2025-01-06 DIAGNOSIS — D509 Iron deficiency anemia, unspecified: Secondary | ICD-10-CM

## 2025-01-06 NOTE — Telephone Encounter (Signed)
 Received call from [facility] with request for patient to come back in for additional iron . Patient has recurrent rectal bleeding and a flex sigmoidoscopy is being arranged. Hgb 8.9. Spoke with Dr. Melanee and IV iron  will be arranged with additional iron  studies.

## 2025-01-13 ENCOUNTER — Inpatient Hospital Stay

## 2025-01-13 ENCOUNTER — Inpatient Hospital Stay: Attending: Oncology

## 2025-01-13 VITALS — BP 172/87 | HR 60 | Temp 97.9°F | Resp 18

## 2025-01-13 DIAGNOSIS — D509 Iron deficiency anemia, unspecified: Secondary | ICD-10-CM

## 2025-01-13 LAB — CBC WITH DIFFERENTIAL (CANCER CENTER ONLY)
Abs Immature Granulocytes: 0.02 10*3/uL (ref 0.00–0.07)
Basophils Absolute: 0 10*3/uL (ref 0.0–0.1)
Basophils Relative: 0 %
Eosinophils Absolute: 0 10*3/uL (ref 0.0–0.5)
Eosinophils Relative: 0 %
HCT: 28.5 % — ABNORMAL LOW (ref 36.0–46.0)
Hemoglobin: 9.4 g/dL — ABNORMAL LOW (ref 12.0–15.0)
Immature Granulocytes: 0 %
Lymphocytes Relative: 5 %
Lymphs Abs: 0.3 10*3/uL — ABNORMAL LOW (ref 0.7–4.0)
MCH: 32 pg (ref 26.0–34.0)
MCHC: 33 g/dL (ref 30.0–36.0)
MCV: 96.9 fL (ref 80.0–100.0)
Monocytes Absolute: 0.4 10*3/uL (ref 0.1–1.0)
Monocytes Relative: 7 %
Neutro Abs: 4.6 10*3/uL (ref 1.7–7.7)
Neutrophils Relative %: 88 %
Platelet Count: 214 10*3/uL (ref 150–400)
RBC: 2.94 MIL/uL — ABNORMAL LOW (ref 3.87–5.11)
RDW: 15.7 % — ABNORMAL HIGH (ref 11.5–15.5)
WBC Count: 5.4 10*3/uL (ref 4.0–10.5)
nRBC: 0 % (ref 0.0–0.2)

## 2025-01-13 LAB — FOLATE: Folate: 9.1 ng/mL

## 2025-01-13 LAB — FERRITIN: Ferritin: 183 ng/mL (ref 11–307)

## 2025-01-13 LAB — VITAMIN B12: Vitamin B-12: 823 pg/mL (ref 180–914)

## 2025-01-13 LAB — IRON AND TIBC
Iron: 54 ug/dL (ref 28–170)
Saturation Ratios: 18 % (ref 10.4–31.8)
TIBC: 295 ug/dL (ref 250–450)
UIBC: 242 ug/dL

## 2025-01-13 MED ORDER — IRON SUCROSE 20 MG/ML IV SOLN
200.0000 mg | INTRAVENOUS | Status: DC
  Administered 2025-01-13: 200 mg via INTRAVENOUS
  Filled 2025-01-13: qty 10

## 2025-01-13 NOTE — Patient Instructions (Signed)
 Iron  Sucrose Injection What is this medication? IRON  SUCROSE (EYE ern SOO krose) treats low levels of iron  (iron  deficiency anemia) in people with kidney disease. Iron  is a mineral that plays an important role in making red blood cells, which carry oxygen from your lungs to the rest of your body. This medicine may be used for other purposes; ask your health care provider or pharmacist if you have questions. COMMON BRAND NAME(S): Venofer  What should I tell my care team before I take this medication? They need to know if you have any of these conditions: Anemia not caused by low iron  levels Heart disease High levels of iron  in the blood Kidney disease Liver disease An unusual or allergic reaction to iron , other medications, foods, dyes, or preservatives Pregnant or trying to get pregnant Breastfeeding How should I use this medication? This medication is infused into a vein. It is given by your care team in a hospital or clinic setting. Talk to your care team about the use of this medication in children. While it may be prescribed for children as young as 2 years for selected conditions, precautions do apply. Overdosage: If you think you have taken too much of this medicine contact a poison control center or emergency room at once. NOTE: This medicine is only for you. Do not share this medicine with others. What if I miss a dose? Keep appointments for follow-up doses. It is important not to miss your dose. Call your care team if you are unable to keep an appointment. What may interact with this medication? Do not take this medication with any of the following: Deferoxamine Dimercaprol Other iron  products This medication may also interact with the following: Chloramphenicol Deferasirox This list may not describe all possible interactions. Give your health care provider a list of all the medicines, herbs, non-prescription drugs, or dietary supplements you use. Also tell them if you smoke,  drink alcohol, or use illegal drugs. Some items may interact with your medicine. What should I watch for while using this medication? Your condition will be monitored carefully while you are receiving this medication. Tell your care team if your symptoms do not start to get better or if they get worse. You may need blood work done while you are taking this medication. Sometimes, when medications are infused into veins, a little can leak out of the vein and into the tissue around it. If this medication leaks, it can cause a brown or dark stain on the skin. This is not common. It may be permanent. If you feel pain or swelling during your infusion, tell your care team right away. They can stop the infusion and treat the area. You may need to eat more foods that contain iron . Talk to your care team. Foods that contain iron  include whole grains or cereals, dried fruits, beans, peas, leafy green vegetables, and organ meats (liver, kidney). What side effects may I notice from receiving this medication? Side effects that you should report to your care team as soon as possible: Allergic reactions--skin rash, itching, hives, swelling of the face, lips, tongue, or throat Low blood pressure--dizziness, feeling faint or lightheaded, blurry vision Painful swelling, warmth, or redness of the skin, brown or dark skin color at the infusion site Shortness of breath Side effects that usually do not require medical attention (report these to your care team if they continue or are bothersome): Flushing Headache Joint pain Muscle pain Nausea This list may not describe all possible side effects. Call your  doctor for medical advice about side effects. You may report side effects to FDA at 1-800-FDA-1088. Where should I keep my medication? This medication is given in a hospital or clinic. It will not be stored at home. NOTE: This sheet is a summary. It may not cover all possible information. If you have questions about  this medicine, talk to your doctor, pharmacist, or health care provider.  2025 Elsevier/Gold Standard (2024-10-13 00:00:00)

## 2025-01-14 ENCOUNTER — Ambulatory Visit: Admitting: Certified Registered"

## 2025-01-14 ENCOUNTER — Ambulatory Visit
Admission: RE | Admit: 2025-01-14 | Discharge: 2025-01-14 | Disposition: A | Attending: Gastroenterology | Admitting: Gastroenterology

## 2025-01-14 ENCOUNTER — Encounter: Admission: RE | Disposition: A | Payer: Self-pay | Attending: Gastroenterology

## 2025-01-14 MED ORDER — GLYCOPYRROLATE 0.2 MG/ML IJ SOLN
INTRAMUSCULAR | Status: DC | PRN
  Administered 2025-01-14: .2 mg via INTRAVENOUS

## 2025-01-14 MED ORDER — PROPOFOL 10 MG/ML IV BOLUS
INTRAVENOUS | Status: DC | PRN
  Administered 2025-01-14: 50 mg via INTRAVENOUS

## 2025-01-14 MED ORDER — PROPOFOL 500 MG/50ML IV EMUL
INTRAVENOUS | Status: DC | PRN
  Administered 2025-01-14: 140 ug/kg/min via INTRAVENOUS

## 2025-01-14 MED ORDER — SODIUM CHLORIDE 0.9 % IV SOLN
INTRAVENOUS | Status: DC
  Administered 2025-01-14: 500 mL via INTRAVENOUS

## 2025-01-14 NOTE — Anesthesia Postprocedure Evaluation (Signed)
"   Anesthesia Post Note  Patient: Brittney Clark  Procedure(s) Performed: [place] SIDE  Patient location during evaluation: PACU Anesthesia Type: General Level of consciousness: awake Pain management: satisfactory to patient Vital Signs Assessment: post-procedure vital signs reviewed and stable Respiratory status: spontaneous breathing Cardiovascular status: stable Anesthetic complications: no   No notable events documented.   Last Vitals:  Vitals:   01/14/25 1026 01/14/25 1036  BP: (!) 139/90 (!) 169/98  Pulse: 69 64  Resp: 16 15  Temp:    SpO2: 100% 100%    Last Pain:  Vitals:   01/14/25 1036  TempSrc:   PainSc: 5                  VAN STAVEREN,Dae Highley      "

## 2025-01-14 NOTE — Interval H&P Note (Signed)
 History and Physical Interval Note:  01/14/2025 9:54 AM  Brittney Clark  has presented today for surgery, with the diagnosis of Rectal bleeding.  The various methods of treatment have been discussed with the patient and family. After consideration of risks, benefits and other options for treatment, the patient has consented to  Procedures: SIGMOIDOSCOPY, FLEXIBLE (N/A) as a surgical intervention.  The patient's history has been reviewed, patient examined, no change in status, stable for surgery.  I have reviewed the patient's chart and labs.  Questions were answered to the patient's satisfaction.     Brittney Clark  Ok to proceed with flex sig

## 2025-01-14 NOTE — Anesthesia Preprocedure Evaluation (Addendum)
"                                    Anesthesia Evaluation  Patient identified by MRN, date of birth, ID band Patient awake    Reviewed: Allergy & Precautions, NPO status , Patient's Chart, lab work & pertinent test results  Airway Mallampati: III  TM Distance: >3 FB Neck ROM: Full    Dental  (+) Teeth Intact   Pulmonary neg pulmonary ROS   Pulmonary exam normal        Cardiovascular Exercise Tolerance: Good hypertension, Pt. on medications Normal cardiovascular exam Rhythm:Regular Rate:Normal     Neuro/Psych negative neurological ROS  negative psych ROS   GI/Hepatic negative GI ROS, hiatal hernia,GERD  Medicated,,(+) Hepatitis -, Autoimmune  Endo/Other  negative endocrine ROS    Renal/GU negative Renal ROS  negative genitourinary   Musculoskeletal  (+) Arthritis ,    Abdominal Normal abdominal exam  (+)   Peds negative pediatric ROS (+)  Hematology negative hematology ROS (+) Blood dyscrasia, anemia   Anesthesia Other Findings Past Medical History: No date: Anemia No date: Arthritis No date: Autoimmune hepatitis (HCC) No date: DDD (degenerative disc disease), lumbar No date: GERD (gastroesophageal reflux disease) No date: Gout No date: Hypertension No date: Immune deficiency disorder No date: Internal hemorrhoids No date: Leukopenia No date: Venous stasis  Past Surgical History: No date: ABDOMINAL HYSTERECTOMY No date: APPENDECTOMY 1980: BREAST BIOPSY; Left     Comment:  neg No date: BREAST SURGERY No date: LEFT OOPHORECTOMY 10/27/2015: MAXIMUM ACCESS (MAS)POSTERIOR LUMBAR INTERBODY FUSION  (PLIF) 1 LEVEL; N/A     Comment:  Procedure: FOR MAXIMUM ACCESS (MAS) POSTERIOR LUMBAR               INTERBODY FUSION (PLIF) L5-S1 MAS with resection of               synovial cyst;  Surgeon: Fairy Levels, MD;  Location: [facility]               NEURO ORS;  Service: Neurosurgery;  Laterality: N/A; 2001: NECK SURGERY 2014: ROTATOR CUFF REPAIR;  Right  BMI    Body Mass Index: 24.03 kg/m      Reproductive/Obstetrics negative OB ROS                              Anesthesia Physical Anesthesia Plan  ASA: 2  Anesthesia Plan: General   Post-op Pain Management:    Induction: Intravenous  PONV Risk Score and Plan: Propofol  infusion and TIVA  Airway Management Planned: Natural Airway and Nasal Cannula  Additional Equipment:   Intra-op Plan:   Post-operative Plan:   Informed Consent: I have reviewed the patients History and Physical, chart, labs and discussed the procedure including the risks, benefits and alternatives for the proposed anesthesia with the patient or authorized representative who has indicated his/her understanding and acceptance.     Dental Advisory Given  Plan Discussed with: CRNA  Anesthesia Plan Comments:          Anesthesia Quick Evaluation  "

## 2025-01-14 NOTE — H&P (Signed)
 Outpatient short stay form Pre-procedure 01/14/2025  Brittney ONEIDA Schick, MD  Primary Physician: Fernande Ophelia JINNY DOUGLAS, MD  Reason for visit:  Rectal bleeding  History of present illness:    84 y/o lady with history of AIH on imuran  and recurrent rectal bleeding attributed to diverticulosis here for flex sig due to small volume rectal bleeding attributed to anal outlet bleeding. No blood thinners. No significant abdominal surgeries. Had colonoscopy last year at [facility] for diverticular bleeding.   Current Medications[1]  Medications Prior to Admission  Medication Sig Dispense Refill Last Dose/Taking   azaTHIOprine  (IMURAN ) 50 MG tablet Take 50 mg by mouth daily.   Past Week   carvedilol  (COREG ) 25 MG tablet Take 25 mg by mouth 2 (two) times daily with a meal.   01/14/2025 Morning   ezetimibe (ZETIA) 10 MG tablet Take 10 mg by mouth daily.   01/14/2025 Morning   isosorbide  mononitrate (IMDUR ) 120 MG 24 hr tablet Take 120 mg by mouth daily. (Patient taking differently: Take by mouth daily. 60mg  in the am, 30mg  nightly)   01/14/2025 Morning   omeprazole (PRILOSEC) 40 MG capsule Take 40 mg by mouth 2 (two) times daily.   Past Week   predniSONE  (DELTASONE ) 5 MG tablet Take 5 mg by mouth daily.   01/13/2025 Morning   triamterene-hydrochlorothiazide (MAXZIDE) 75-50 MG tablet Take 1 tablet by mouth daily.   01/14/2025   vitamin B-12 (CYANOCOBALAMIN ) 500 MCG tablet Take 500 mcg by mouth daily.   Past Week Morning   aspirin  EC 81 MG tablet Take 81 mg by mouth daily. (Patient not taking: Reported on 09/03/2023)      Calcium -Magnesium-Vitamin D  (CALCIUM  1200+D3 PO) Take 1 tablet by mouth in the morning and at bedtime. (Patient not taking: Reported on 03/03/2024)      cyclobenzaprine  (FLEXERIL ) 10 MG tablet Take 1 tablet (10 mg total) by mouth 3 (three) times daily as needed for muscle spasms. (Patient not taking: Reported on 06/17/2022) 30 tablet 0    DILT-XR 240 MG 24 hr capsule Take 240 mg by mouth daily. (Patient not  taking: Reported on 09/18/2023)      HYDROcodone -acetaminophen  (NORCO/VICODIN) 5-325 MG tablet Take 1-2 tablets by mouth every 4 (four) hours as needed (pain). (Patient not taking: Reported on 09/03/2022) 30 tablet 0    Melatonin 1 MG CAPS Take by mouth as needed.        Allergies[2]   Past Medical History:  Diagnosis Date   Anemia    Arthritis    Autoimmune hepatitis (HCC)    DDD (degenerative disc disease), lumbar    GERD (gastroesophageal reflux disease)    Gout    Hypertension    Immune deficiency disorder    Internal hemorrhoids    Leukopenia    Venous stasis     Review of systems:  Otherwise negative.    Physical Exam  Gen: Alert, oriented. Appears stated age.  HEENT: PERRLA. Lungs: No respiratory distress CV: RRR Abd: soft, benign, no masses Ext: No edema    Planned procedures: Proceed with flex sig. The patient understands the nature of the planned procedure, indications, risks, alternatives and potential complications including but not limited to bleeding, infection, perforation, damage to internal organs and possible oversedation/side effects from anesthesia. The patient agrees and gives consent to proceed.  Please refer to procedure notes for findings, recommendations and patient disposition/instructions.     Brittney ONEIDA Schick, MD [facility]         [1] No current  facility-administered medications for this encounter. [2]  Allergies Allergen Reactions   Ferrlecit [Na Ferric Gluc Cplx In Sucrose] Shortness Of Breath and Other (See Comments)    History of allergic reaction to ferric gluconate. Chest pain required ER evaluation   Amlodipine Besy-Benazepril Hcl Other (See Comments)    Hair loss   Atorvastatin Other (See Comments)    Hair loss   Clonidine Other (See Comments)    Hair loss   Diltiazem  Other (See Comments)    Hair loss   Hydralazine Other (See Comments)   Penicillins Rash    Has patient had a PCN reaction causing  immediate rash, facial/tongue/throat swelling, SOB or lightheadedness with hypotension: No Has patient had a PCN reaction causing severe rash involving mucus membranes or skin necrosis: No Has patient had a PCN reaction that required hospitalization No Has patient had a PCN reaction occurring within the last 10 years: No If all of the above answers are NO, then may proceed with Cephalosporin use.

## 2025-01-14 NOTE — Op Note (Signed)
 [facility] Gastroenterology Patient Name: Brittney Clark Procedure Date: 01/14/2025 9:47 AM MRN: [id number] Account #: [id number] Date of Birth: [birth year] Admit Type: Outpatient Age: 84 Room: [facility] ENDO ROOM 3 Gender: Female Note Status: Finalized Instrument Name: Colon Scope [id number] Procedure:             Flexible Sigmoidoscopy Indications:           Rectal hemorrhage Providers:             Ole Schick MD, MD Referring MD:          Ophelia Sage, MD (Referring MD) Medicines:             Monitored Anesthesia Care Complications:         No immediate complications. Procedure:             Pre-Anesthesia Assessment:                        - Prior to the procedure, a History and Physical was                         performed, and patient medications and allergies were                         reviewed. The patient is competent. The risks and                         benefits of the procedure and the sedation options and                         risks were discussed with the patient. All questions                         were answered and informed consent was obtained.                         Patient identification and proposed procedure were                         verified by the physician, the nurse, the                         anesthesiologist, the anesthetist and the technician                         in the endoscopy suite. Mental Status Examination:                         alert and oriented. Airway Examination: normal                         oropharyngeal airway and neck mobility. Respiratory                         Examination: clear to auscultation. CV Examination:                         normal. Prophylactic Antibiotics: The patient does not  require prophylactic antibiotics. Prior                         Anticoagulants: The patient has taken no anticoagulant                         or antiplatelet agents. ASA Grade Assessment: II - A                          patient with mild systemic disease. After reviewing                         the risks and benefits, the patient was deemed in                         satisfactory condition to undergo the procedure. The                         anesthesia plan was to use monitored anesthesia care                         (MAC). Immediately prior to administration of                         medications, the patient was re-assessed for adequacy                         to receive sedatives. The heart rate, respiratory                         rate, oxygen saturations, blood pressure, adequacy of                         pulmonary ventilation, and response to care were                         monitored throughout the procedure. The physical                         status of the patient was re-assessed after the                         procedure.                        After obtaining informed consent, the scope was passed                         under direct vision. The Colonoscope was introduced                         through the anus and advanced to the the left                         transverse colon. The flexible sigmoidoscopy was                         accomplished without difficulty. The patient tolerated  the procedure well. The quality of the bowel                         preparation was good. Findings:      The perianal and digital rectal examinations were normal.      Red blood was found in the sigmoid colon, in the descending colon and at       the splenic flexure. The transverse colon was normal and did not contain       any blood.      Many small-mouthed diverticula were found in the sigmoid colon and       descending colon.      Internal hemorrhoids were found during retroflexion. The hemorrhoids       were Grade I (internal hemorrhoids that do not prolapse). Impression:            - Blood in the sigmoid colon, in the descending colon                          and at the splenic flexure. Suspect from diverticular                         bleeding given no other lesions needed.                        - Diverticulosis in the sigmoid colon and in the                         descending colon.                        - Internal hemorrhoids.                        - No specimens collected. Recommendation:        - Discharge patient to home.                        - Resume previous diet.                        - Continue present medications.                        - Return to referring physician as previously                         scheduled. Difficult situation, suspect high Procedure Code(s):     --- Professional ---                        [id number], Sigmoidoscopy, flexible; diagnostic, including                         collection of specimen(s) by brushing or washing, when                         performed (separate procedure) Diagnosis Code(s):     --- Professional ---                        K92.2, Gastrointestinal hemorrhage, unspecified  K64.0, First degree hemorrhoids                        K62.5, Hemorrhage of anus and rectum                        K57.30, Diverticulosis of large intestine without                         perforation or abscess without bleeding CPT copyright 2022 [employer]. All rights reserved. The codes documented in this report are preliminary and upon coder review may  be revised to meet current compliance requirements. Ole Schick MD, MD 01/14/2025 10:29:42 AM Number of Addenda: 0 Note Initiated On: 01/14/2025 9:47 AM Total Procedure Duration: 0 hours 6 minutes 42 seconds  Estimated Blood Loss:  Estimated blood loss: none.      [facility]

## 2025-01-14 NOTE — Transfer of Care (Signed)
 Immediate Anesthesia Transfer of Care Note  Patient: Brittney Clark  Procedure(s) Performed: [place] SIDE  Patient Location: PACU  Anesthesia Type:MAC  Level of Consciousness: awake and drowsy  Airway & Oxygen Therapy: Patient Spontanous Breathing and Patient connected to face mask oxygen  Post-op Assessment: Report given to RN and Post -op Vital signs reviewed and stable  Post vital signs: Reviewed and stable  Last Vitals:  Vitals Value Taken Time  BP 93/61 01/14/25 10:16  Temp    Pulse 69 01/14/25 10:16  Resp 16 01/14/25 10:16  SpO2 98 % 01/14/25 10:16  Vitals shown include unfiled device data.  Last Pain:  Vitals:   01/14/25 1016  TempSrc:   PainSc: Asleep         Complications: No notable events documented.

## 2025-02-24 ENCOUNTER — Inpatient Hospital Stay

## 2025-06-27 ENCOUNTER — Inpatient Hospital Stay

## 2025-06-27 ENCOUNTER — Inpatient Hospital Stay: Admitting: Oncology

## 2025-12-16 ENCOUNTER — Ambulatory Visit (INDEPENDENT_AMBULATORY_CARE_PROVIDER_SITE_OTHER): Admitting: Nurse Practitioner

## 2025-12-16 ENCOUNTER — Other Ambulatory Visit (INDEPENDENT_AMBULATORY_CARE_PROVIDER_SITE_OTHER)
